# Patient Record
Sex: Female | Born: 2000 | Race: Black or African American | Hispanic: No | Smoking: Never smoker
Health system: Southern US, Community
[De-identification: ages and names within clinical notes are randomized; demographics above are authoritative.]

## PROBLEM LIST (undated history)

## (undated) ENCOUNTER — Ambulatory Visit (HOSPITAL_COMMUNITY): Payer: MEDICAID

## (undated) DIAGNOSIS — F32A Depression, unspecified: Secondary | ICD-10-CM

## (undated) DIAGNOSIS — B009 Herpesviral infection, unspecified: Secondary | ICD-10-CM

## (undated) DIAGNOSIS — J302 Other seasonal allergic rhinitis: Secondary | ICD-10-CM

## (undated) DIAGNOSIS — I10 Essential (primary) hypertension: Secondary | ICD-10-CM

## (undated) HISTORY — PX: TONSILLECTOMY: SUR1361

---
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
## 2019-01-18 ENCOUNTER — Other Ambulatory Visit: Payer: Self-pay

## 2019-01-18 ENCOUNTER — Encounter (HOSPITAL_COMMUNITY): Payer: Self-pay | Admitting: Emergency Medicine

## 2019-01-18 ENCOUNTER — Ambulatory Visit (HOSPITAL_COMMUNITY)
Admission: EM | Admit: 2019-01-18 | Discharge: 2019-01-18 | Disposition: A | Discharge status: Home / Self Care | Payer: BC Managed Care – PPO | Attending: Internal Medicine | Admitting: Internal Medicine

## 2019-01-18 DIAGNOSIS — R300 Dysuria: Secondary | ICD-10-CM | POA: Diagnosis not present

## 2019-01-18 LAB — POCT URINALYSIS DIP (DEVICE)
Bilirubin Urine: NEGATIVE
Glucose, UA: NEGATIVE mg/dL
Ketones, ur: NEGATIVE mg/dL
Nitrite: NEGATIVE
Protein, ur: NEGATIVE mg/dL
Specific Gravity, Urine: 1.02 (ref 1.005–1.030)
Urobilinogen, UA: 0.2 mg/dL (ref 0.0–1.0)
pH: 7 (ref 5.0–8.0)

## 2019-01-18 MED ORDER — CEPHALEXIN 500 MG PO CAPS: 500.0000 mg | ORAL_CAPSULE | Freq: Two times a day (BID) | ORAL | 0 refills | Status: AC

## 2019-01-18 NOTE — ED Provider Notes (Signed)
[facility]    CSN: [id number] Arrival date & time: 01/18/19  1351      History   Chief Complaint Chief Complaint  Patient presents with  . Recurrent UTI    HPI Brooke Robinson is a 18 y.o. female history of hypertension, presenting today for evaluation of possible UTI.  Patient states that over the past couple days she has developed dysuria, cloudy urine and urinary frequency.  Feels similar to when she has previously had UTIs.  She has had these more recurrently of recently as she notes that she has not been urinating after intercourse.  Feels this has been a trigger.  She denies any abnormal vaginal discharge, itching or irritation.  Denies fevers nausea or vomiting.  Denies abdominal pain.  HPI  Past Medical History:  Diagnosis Date  . Herpes   . Hypertension   . Seasonal allergies     Patient Active Problem List   Diagnosis Date Noted  . Pregnant 08/27/2018    Past Surgical History:  Procedure Laterality Date  . TONSILLECTOMY      OB History    Gravida  1   Para  1   Term  1   Preterm      AB      Living  1     SAB      TAB      Ectopic      Multiple  0   Live Births  1            Home Medications    Prior to Admission medications   Medication Sig Start Date End Date Taking? Authorizing Provider  acyclovir (ZOVIRAX) 400 MG tablet TAKE 1 TABLET BY MOUTH TWICE DAILY FOR SUPPRESSION THERAPY. 11/03/18  Yes [provider]  levonorgestrel (MIRENA, 52 MG,) 20 MCG/24HR IUD Mirena 20 mcg/24 hours (6 yrs) 52 mg intrauterine device  Take 1 device by intrauterine route.   Yes [provider]  cephALEXin (KEFLEX) 500 MG capsule Take 1 capsule (500 mg total) by mouth 2 (two) times daily for 7 days. 01/18/19 01/25/19  Wieters, Hallie C, PA-C  ibuprofen (ADVIL) 600 MG tablet Take 1 tablet (600 mg total) by mouth every 6 (six) hours as needed. 08/29/18   Waynard Reedsoss, Kendra, MD  Prenatal Vit-Fe Fumarate-FA (MULTIVITAMIN-PRENATAL)  27-0.8 MG TABS tablet Take 1 tablet by mouth daily at 12 noon.    [provider]  fluticasone (FLONASE ALLERGY RELIEF) 50 MCG/ACT nasal spray Place 2 sprays into the nose daily as needed for allergies.   01/18/19  [provider]    Family History Family History  Problem Relation Age of Onset  . Healthy Mother     Social History Social History   Tobacco Use  . Smoking status: Never Smoker  . Smokeless tobacco: Never Used  Substance Use Topics  . Alcohol use: Never    Frequency: Never  . Drug use: Never     Allergies   Patient has no known allergies.   Review of Systems Review of Systems  Constitutional: Negative for fever.  Respiratory: Negative for shortness of breath.   Cardiovascular: Negative for chest pain.  Gastrointestinal: Negative for abdominal pain, diarrhea, nausea and vomiting.  Genitourinary: Positive for dysuria and frequency. Negative for flank pain, genital sores, hematuria, menstrual problem, vaginal bleeding, vaginal discharge and vaginal pain.  Musculoskeletal: Negative for back pain.  Skin: Negative for rash.  Neurological: Negative for dizziness, light-headedness and headaches.     Physical Exam  Triage Vital Signs ED Triage Vitals  Enc Vitals Group     BP 01/18/19 1452 (!) 140/93     Pulse Rate 01/18/19 1452 61     Resp 01/18/19 1452 12     Temp 01/18/19 1452 98.6 F (37 C)     Temp Source 01/18/19 1452 Oral     SpO2 01/18/19 1452 100 %     Weight --      Height --      Head Circumference --      Peak Flow --      Pain Score 01/18/19 1453 0     Pain Loc --      Pain Edu? --      Excl. in GC? --    No data found.  Updated Vital Signs BP (!) 140/93 (BP Location: Right Arm)   Pulse 61   Temp 98.6 F (37 C) (Oral)   Resp 12   SpO2 100%   Visual Acuity Right Eye Distance:   Left Eye Distance:   Bilateral Distance:    Right Eye Near:   Left Eye Near:    Bilateral Near:     Physical Exam Vitals signs and  nursing note reviewed.  Constitutional:      Appearance: She is well-developed.     Comments: No acute distress  HENT:     Head: Normocephalic and atraumatic.     Nose: Nose normal.  Eyes:     Conjunctiva/sclera: Conjunctivae normal.  Neck:     Musculoskeletal: Neck supple.  Cardiovascular:     Rate and Rhythm: Normal rate.  Pulmonary:     Effort: Pulmonary effort is normal. No respiratory distress.  Abdominal:     General: There is no distension.     Comments: Soft, nondistended, nontender to light and deep palpation throughout abdomen  Musculoskeletal: Normal range of motion.  Skin:    General: Skin is warm and dry.  Neurological:     Mental Status: She is alert and oriented to person, place, and time.      UC Treatments / Results  Labs (all labs ordered are listed, but only abnormal results are displayed) Labs Reviewed  POCT URINALYSIS DIP (DEVICE) - Abnormal; Notable for the following components:      Result Value   Hgb urine dipstick TRACE (*)    Leukocytes,Ua MODERATE (*)    All other components within normal limits  URINE CULTURE    EKG   Radiology No results found.  Procedures Procedures (including critical care time)  Medications Ordered in UC Medications - No data to display  Initial Impression / Assessment and Plan / UC Course  I have reviewed the triage vital signs and the nursing notes.  Pertinent labs & imaging results that were available during my care of the patient were reviewed by me and considered in my medical decision making (see chart for details).     Moderate leuks on UA, will send for culture.  Previous culture grew out Klebsiella and was sensitive to Keflex.  Will repeat treatment with Keflex today.  Will repeat culture.  Push fluids.  Continue to monitor,Discussed strict return precautions. Patient verbalized understanding and is agreeable with plan.  Final Clinical Impressions(s) / UC Diagnoses   Final diagnoses:  Dysuria      Discharge Instructions     Urine showed evidence of infection. We are treating you with keflex, twice daily for 1 week. Be sure to take full course. Stay hydrated- urine should  be pale yellow to clear. May use over the counter azo for relief of burning while infection is being cleared.   Please return or follow up with your primary provider if symptoms not improving with treatment. Please return sooner if you have worsening of symptoms or develop fever, nausea, vomiting, abdominal pain, back pain, lightheadedness, dizziness.   ED Prescriptions    Medication Sig Dispense Auth. Provider   cephALEXin (KEFLEX) 500 MG capsule Take 1 capsule (500 mg total) by mouth 2 (two) times daily for 7 days. 14 capsule Wieters, Castalia C, PA-C     PDMP not reviewed this encounter.   Janith Lima, [place] 01/18/19 2209

## 2019-01-18 NOTE — Discharge Instructions (Signed)
Urine showed evidence of infection. We are treating you with keflex, twice daily for 1 week. Be sure to take full course. Stay hydrated- urine should be pale yellow to clear. May use over the counter azo for relief of burning while infection is being cleared.   Please return or follow up with your primary provider if symptoms not improving with treatment. Please return sooner if you have worsening of symptoms or develop fever, nausea, vomiting, abdominal pain, back pain, lightheadedness, dizziness.

## 2019-01-19 ENCOUNTER — Encounter (HOSPITAL_COMMUNITY): Payer: Self-pay

## 2019-01-19 ENCOUNTER — Ambulatory Visit (HOSPITAL_COMMUNITY)
Admission: EM | Admit: 2019-01-19 | Discharge: 2019-01-19 | Disposition: A | Discharge status: Home / Self Care | Payer: BC Managed Care – PPO | Attending: Emergency Medicine | Admitting: Emergency Medicine

## 2019-01-19 DIAGNOSIS — S0990XA Unspecified injury of head, initial encounter: Secondary | ICD-10-CM | POA: Diagnosis not present

## 2019-01-19 MED ORDER — IBUPROFEN 600 MG PO TABS: 600.0000 mg | ORAL_TABLET | Freq: Four times a day (QID) | ORAL | 0 refills | Status: DC | PRN

## 2019-01-19 NOTE — Discharge Instructions (Addendum)
You were diagnosed today with a mild head injury Please follow up with your primary care provider Please go to nearest ED if  your symptom get worse or you are having Inabilty to awaken at time of expected wakening Severe or worsening headache Somnolence or confusion Restlessness, unsteadiness or seizures Difficulty with vision Vomiting, fever or stiff neck Urinary or bowel incontinence weakness and numbness involving any body part

## 2019-01-19 NOTE — ED Provider Notes (Signed)
[facility]    CSN: [id number] Arrival date & time: 01/19/19  1725      History   Chief Complaint Chief Complaint  Patient presents with  . Head Injury    HPI Brooke Robinson is a 18 y.o. female.   18 year old presented at the Urgent Care with a complaint mild headache after 2 boxed hit her head.  She was loading the truck and the boxes fell off and hit the occipital part of her head. She stated the boxes weight about 30 pounds each. She denied vomiting, abnormal behavior and agitation at this time.  The history is provided by the patient. No language interpreter was used.  Head Injury Associated symptoms: headache   Associated symptoms: no numbness     Past Medical History:  Diagnosis Date  . Herpes   . Hypertension   . Seasonal allergies     Patient Active Problem List   Diagnosis Date Noted  . Pregnant 08/27/2018    Past Surgical History:  Procedure Laterality Date  . TONSILLECTOMY      OB History    Gravida  1   Para  1   Term  1   Preterm      AB      Living  1     SAB      TAB      Ectopic      Multiple  0   Live Births  1            Home Medications    Prior to Admission medications   Medication Sig Start Date End Date Taking? Authorizing Provider  acyclovir (ZOVIRAX) 400 MG tablet TAKE 1 TABLET BY MOUTH TWICE DAILY FOR SUPPRESSION THERAPY. 11/03/18   [provider]  cephALEXin (KEFLEX) 500 MG capsule Take 1 capsule (500 mg total) by mouth 2 (two) times daily for 7 days. 01/18/19 01/25/19  Wieters, Hallie C, PA-C  ibuprofen (ADVIL) 600 MG tablet Take 1 tablet (600 mg total) by mouth every 6 (six) hours as needed. 01/19/19   Denim Kalmbach, Zachery Dakins, FNP  levonorgestrel (MIRENA, 52 MG,) 20 MCG/24HR IUD Mirena 20 mcg/24 hours (6 yrs) 52 mg intrauterine device  Take 1 device by intrauterine route.    [provider]  Prenatal Vit-Fe Fumarate-FA (MULTIVITAMIN-PRENATAL) 27-0.8 MG TABS tablet Take 1 tablet by  mouth daily at 12 noon.    [provider]  fluticasone (FLONASE ALLERGY RELIEF) 50 MCG/ACT nasal spray Place 2 sprays into the nose daily as needed for allergies.   01/18/19  [provider]    Family History Family History  Problem Relation Age of Onset  . Healthy Mother     Social History Social History   Tobacco Use  . Smoking status: Never Smoker  . Smokeless tobacco: Never Used  Substance Use Topics  . Alcohol use: Never    Frequency: Never  . Drug use: Never     Allergies   Patient has no known allergies.   Review of Systems Review of Systems  Constitutional: Negative for activity change, appetite change, chills, fatigue and fever.  HENT: Negative for sinus pain and sore throat.   Respiratory: Negative for cough and chest tightness.   Cardiovascular: Negative for chest pain and leg swelling.  Neurological: Positive for light-headedness and headaches. Negative for dizziness, tremors, speech difficulty, weakness and numbness.  ROS: all other are negatives  Physical Exam Triage Vital Signs ED Triage Vitals  Enc Vitals Group  BP 01/19/19 1739 139/77     Pulse Rate 01/19/19 1739 72     Resp 01/19/19 1739 14     Temp 01/19/19 1739 98.7 F (37.1 C)     Temp Source 01/19/19 1739 Oral     SpO2 01/19/19 1739 100 %     Weight --      Height --      Head Circumference --      Peak Flow --      Pain Score 01/19/19 1737 5     Pain Loc --      Pain Edu? --      Excl. in GC? --    No data found.  Updated Vital Signs BP 139/77 (BP Location: Right Arm)   Pulse 72   Temp 98.7 F (37.1 C) (Oral)   Resp 14   LMP 12/18/2018 (Exact Date)   SpO2 100%   Visual Acuity Right Eye Distance:   Left Eye Distance:   Bilateral Distance:    Right Eye Near:   Left Eye Near:    Bilateral Near:     Physical Exam Vitals signs and nursing note reviewed.  Constitutional:      General: She is not in acute distress.    Appearance: Normal appearance.  She is normal weight. She is not ill-appearing or toxic-appearing.  HENT:     Head: Normocephalic.     Right Ear: Tympanic membrane, ear canal and external ear normal.     Left Ear: Tympanic membrane, ear canal and external ear normal.  Eyes:     Pupils: Pupils are equal, round, and reactive to light.  Cardiovascular:     Rate and Rhythm: Normal rate and regular rhythm.     Pulses: Normal pulses.     Heart sounds: Normal heart sounds. No murmur. No gallop.   Pulmonary:     Effort: Pulmonary effort is normal. No respiratory distress.     Breath sounds: Normal breath sounds. No stridor. No wheezing or rhonchi.  Neurological:     Mental Status: She is alert and oriented to person, place, and time.     GCS: GCS eye subscore is 4. GCS verbal subscore is 5. GCS motor subscore is 6.     Cranial Nerves: Cranial nerves are intact. No facial asymmetry.     Sensory: Sensation is intact. No sensory deficit.     Motor: Motor function is intact. No weakness or tremor.     Coordination: Coordination is intact. Coordination normal. Heel to Shin Test normal.     Gait: Gait is intact. Gait normal.     Deep Tendon Reflexes:     Reflex Scores:      Patellar reflexes are 2+ on the right side.     UC Treatments / Results  Labs (all labs ordered are listed, but only abnormal results are displayed) Labs Reviewed - No data to display  EKG   Radiology No results found.  Procedures Procedures (including critical care time)  Medications Ordered in UC Medications - No data to display  Initial Impression / Assessment and Plan / UC Course  I have reviewed the triage vital signs and the nursing notes.  Pertinent labs & imaging results that were available during my care of the patient were reviewed by me and considered in my medical decision making (see chart for details).   patient is stable and was discharge home. Ibuprofen was prescribed for pain management and she was advised to follow up with  PCP or go to ED if symptom get worse  Final Clinical Impressions(s) / UC Diagnoses   Final diagnoses:  Injury of head, initial encounter     Discharge Instructions     You were diagnosed today with a mild head injury Please follow up with your primary care provider Please go to nearest ED if  your symptom get worse or you are having Inabilty to awaken at time of expected wakening Severe or worsening headache Somnolence or confusion Restlessness, unsteadiness or seizures Difficulty with vision Vomiting, fever or stiff neck Urinary or bowel incontinence weakness and numbness involving any body part    ED Prescriptions    Medication Sig Dispense Auth. Provider   ibuprofen (ADVIL) 600 MG tablet Take 1 tablet (600 mg total) by mouth every 6 (six) hours as needed. 30 tablet Sanjay Broadfoot, Darrelyn Hillock, FNP     PDMP not reviewed this encounter.   Emerson Monte, [place] 01/19/19 1843

## 2019-01-19 NOTE — ED Triage Notes (Signed)
Pt states she injured her head last night at work, 2 boxes over 30 pounds hit her head. Pt states she feels sleepy and mild headache since last night. Pt has concern for concussions as she had concussions before. Pt denies any other symptoms.

## 2019-01-20 ENCOUNTER — Encounter (HOSPITAL_COMMUNITY): Payer: Self-pay

## 2019-01-20 ENCOUNTER — Telehealth (HOSPITAL_COMMUNITY): Payer: Self-pay | Admitting: Emergency Medicine

## 2019-01-20 LAB — URINE CULTURE: Culture: 60000 — AB

## 2019-01-20 MED ORDER — CIPROFLOXACIN HCL 500 MG PO TABS: 500.0000 mg | ORAL_TABLET | Freq: Two times a day (BID) | ORAL | 0 refills | Status: AC

## 2019-01-20 NOTE — Telephone Encounter (Signed)
Urine culture was treated with keflex, urine culture shows two separate organisms. Per [facility], stop taking the medication she is taking right now, and switch to cipro 500mg  BID x7 days. No working number on file. Notified in mychart.

## 2019-01-22 ENCOUNTER — Telehealth: Payer: Self-pay | Admitting: Emergency Medicine

## 2019-01-22 NOTE — Telephone Encounter (Signed)
Attempted to reach patient x2. No answer at this time. Call cannot be completed.   

## 2019-01-25 ENCOUNTER — Telehealth: Payer: Self-pay | Admitting: Emergency Medicine

## 2019-01-25 NOTE — Telephone Encounter (Signed)
Attempted to reach patient x3. No answer at this time. No number on file working. Letter sent

## 2019-03-06 ENCOUNTER — Other Ambulatory Visit: Payer: Self-pay

## 2019-03-06 ENCOUNTER — Encounter (HOSPITAL_COMMUNITY): Payer: Self-pay | Admitting: Emergency Medicine

## 2019-03-06 ENCOUNTER — Ambulatory Visit (HOSPITAL_COMMUNITY)
Admission: EM | Admit: 2019-03-06 | Discharge: 2019-03-06 | Disposition: A | Discharge status: Home / Self Care | Payer: BC Managed Care – PPO | Attending: Family Medicine | Admitting: Family Medicine

## 2019-03-06 DIAGNOSIS — Z0189 Encounter for other specified special examinations: Secondary | ICD-10-CM | POA: Insufficient documentation

## 2019-03-06 DIAGNOSIS — I10 Essential (primary) hypertension: Secondary | ICD-10-CM | POA: Diagnosis not present

## 2019-03-06 DIAGNOSIS — R490 Dysphonia: Secondary | ICD-10-CM | POA: Diagnosis not present

## 2019-03-06 DIAGNOSIS — Z1152 Encounter for screening for COVID-19: Secondary | ICD-10-CM

## 2019-03-06 DIAGNOSIS — U071 COVID-19: Secondary | ICD-10-CM | POA: Insufficient documentation

## 2019-03-06 NOTE — Discharge Instructions (Signed)
If your Covid-19 test is positive, you will receive a phone call from [facility] regarding your results. Negative test results are not called. Both positive and negative results area always visible on MyChart. If you do not have a MyChart account, sign up instructions are in your discharge papers.  

## 2019-03-06 NOTE — ED Triage Notes (Signed)
Onset yesterday of feeling bad losing her voice

## 2019-03-07 LAB — NOVEL CORONAVIRUS, NAA (HOSP ORDER, SEND-OUT TO REF LAB; TAT 18-24 HRS): SARS-CoV-2, NAA: DETECTED — AB

## 2019-03-08 NOTE — ED Provider Notes (Signed)
[facility]   [id number] 03/06/19 Arrival Time: 4097  ASSESSMENT & PLAN:  1. Patient request for diagnostic testing      COVID-19 testing sent. To self-quarantine until results are available. If requested, work note provided.  Follow-up Information    [facility].   Specialty: Urgent Care Why: As needed. Contact information: [place] [phone]          Reviewed expectations re: course of current medical issues. Questions answered. Outlined signs and symptoms indicating need for more acute intervention. Patient verbalized understanding. After Visit Summary given.   SUBJECTIVE: History from: patient. Brooke Robinson is a 19 y.o. female who requests COVID-19 testing. Known COVID-19 contact: none. Recent travel: none. Denies: runny nose, congestion, fever, cough, sore throat, difficulty breathing and headache. Does reports "feeling bad and losing my voice a little" since yesterday. Normal PO intake without n/v/d.  ROS: As per HPI.   OBJECTIVE:  Vitals:   03/06/19 1551  BP: (!) 145/92  Pulse: 97  Resp: 16  Temp: 98.6 F (37 C)  TempSrc: Oral  SpO2: 99%    General appearance: alert; no distress Eyes: PERRLA; EOMI; conjunctiva normal HENT: [place]; AT; nasal mucosa normal; oral mucosa normal Neck: supple  Lungs: speaks full sentences without difficulty; unlabored Heart: regular rate and rhythm Abdomen: soft, non-tender Extremities: no edema Skin: warm and dry Neurologic: normal gait Psychological: alert and cooperative; normal mood and affect    No Known Allergies  Past Medical History:  Diagnosis Date  . Herpes   . Hypertension   . Seasonal allergies    Social History   Socioeconomic History  . Marital status: Single    Spouse name: Not on file  . Number of children: Not on file  . Years of education: Not on file  . Highest education level: Not on file    Occupational History  . Not on file  Tobacco Use  . Smoking status: Never Smoker  . Smokeless tobacco: Never Used  Substance and Sexual Activity  . Alcohol use: Never  . Drug use: Never  . Sexual activity: Not Currently  Other Topics Concern  . Not on file  Social History Narrative  . Not on file   Social Determinants of Health   Financial Resource Strain:   . Difficulty of Paying Living Expenses: Not on file  Food Insecurity:   . Worried About Charity fundraiser in the Last Year: Not on file  . Ran Out of Food in the Last Year: Not on file  Transportation Needs:   . Lack of Transportation (Medical): Not on file  . Lack of Transportation (Non-Medical): Not on file  Physical Activity:   . Days of Exercise per Week: Not on file  . Minutes of Exercise per Session: Not on file  Stress:   . Feeling of Stress : Not on file  Social Connections:   . Frequency of Communication with Friends and Family: Not on file  . Frequency of Social Gatherings with Friends and Family: Not on file  . Attends Religious Services: Not on file  . Active Member of Clubs or Organizations: Not on file  . Attends Archivist Meetings: Not on file  . Marital Status: Not on file  Intimate Partner Violence:   . Fear of Current or Ex-Partner: Not on file  . Emotionally Abused: Not on file  . Physically Abused: Not on file  . Sexually Abused:  Not on file   Family History  Problem Relation Age of Onset  . Healthy Mother    Past Surgical History:  Procedure Laterality Date  . Greig Right, MD 03/08/19 [phone]

## 2019-03-09 ENCOUNTER — Telehealth: Payer: Self-pay | Admitting: Emergency Medicine

## 2019-03-09 NOTE — Telephone Encounter (Signed)

## 2019-03-22 ENCOUNTER — Ambulatory Visit (HOSPITAL_COMMUNITY)
Admission: EM | Admit: 2019-03-22 | Discharge: 2019-03-22 | Disposition: A | Discharge status: Home / Self Care | Payer: BC Managed Care – PPO | Attending: Family Medicine | Admitting: Family Medicine

## 2019-03-22 ENCOUNTER — Other Ambulatory Visit: Payer: Self-pay

## 2019-03-22 ENCOUNTER — Encounter (HOSPITAL_COMMUNITY): Payer: Self-pay

## 2019-03-22 DIAGNOSIS — R102 Pelvic and perineal pain: Secondary | ICD-10-CM | POA: Insufficient documentation

## 2019-03-22 DIAGNOSIS — Z8616 Personal history of COVID-19: Secondary | ICD-10-CM | POA: Diagnosis not present

## 2019-03-22 DIAGNOSIS — I10 Essential (primary) hypertension: Secondary | ICD-10-CM | POA: Insufficient documentation

## 2019-03-22 DIAGNOSIS — Z793 Long term (current) use of hormonal contraceptives: Secondary | ICD-10-CM | POA: Insufficient documentation

## 2019-03-22 MED ORDER — NAPROXEN 500 MG PO TABS: 500.0000 mg | ORAL_TABLET | Freq: Two times a day (BID) | ORAL | 0 refills | Status: DC

## 2019-03-22 NOTE — ED Triage Notes (Signed)
Pt presents with lower pelvic pain X 3 days; pt believes it to be related to her IUD that was placed in august.  Pt was positive for covid on 03/06/2019

## 2019-03-22 NOTE — ED Provider Notes (Signed)
[place]    CSN: [id number] Arrival date & time: 03/22/19  1734      History   Chief Complaint Chief Complaint  Patient presents with  . Abdominal Pain    Lower Pelvic    HPI Brooke Robinson is a 19 y.o. female.   HPI  Patient has pelvic crampy pain for 3 days.  She thinks is from her IUD.  She figured this out by looking up on the Internet.  She has not had any problems with her IUD since it was placed.  She has a 45-month-old child.  She is not breast-feeding. She was Covid positive on 03/06/2019.  She had minimal symptoms.  She is no longer symptomatic. She has unprotected sexual relations.  She is certain she is not pregnant because of the IUD.  She is not having any vaginal discharge, bleeding, or signs of infection  Past Medical History:  Diagnosis Date  . Herpes   . Hypertension   . Seasonal allergies     Patient Active Problem List   Diagnosis Date Noted  . Pregnant 08/27/2018    Past Surgical History:  Procedure Laterality Date  . TONSILLECTOMY      OB History    Gravida  1   Para  1   Term  1   Preterm      AB      Living  1     SAB      TAB      Ectopic      Multiple  0   Live Births  1            Home Medications    Prior to Admission medications   Medication Sig Start Date End Date Taking? Authorizing Provider  acyclovir (ZOVIRAX) 400 MG tablet TAKE 1 TABLET BY MOUTH TWICE DAILY FOR SUPPRESSION THERAPY. 11/03/18   [provider]  levonorgestrel (MIRENA, 52 MG,) 20 MCG/24HR IUD Mirena 20 mcg/24 hours (6 yrs) 52 mg intrauterine device  Take 1 device by intrauterine route.    [provider]  naproxen (NAPROSYN) 500 MG tablet Take 1 tablet (500 mg total) by mouth 2 (two) times daily. 03/22/19   Raylene Everts, MD  Prenatal Vit-Fe Fumarate-FA (MULTIVITAMIN-PRENATAL) 27-0.8 MG TABS tablet Take 1 tablet by mouth daily at 12 noon.    [provider]  fluticasone (FLONASE ALLERGY RELIEF) 50  MCG/ACT nasal spray Place 2 sprays into the nose daily as needed for allergies.   01/18/19  [provider]    Family History Family History  Problem Relation Age of Onset  . Healthy Mother     Social History Social History   Tobacco Use  . Smoking status: Never Smoker  . Smokeless tobacco: Never Used  Substance Use Topics  . Alcohol use: Never  . Drug use: Never     Allergies   Patient has no known allergies.   Review of Systems Review of Systems  Constitutional: Negative for chills and fever.  Gastrointestinal: Positive for abdominal pain.  Genitourinary: Positive for pelvic pain. Negative for dysuria, vaginal bleeding and vaginal discharge.     Physical Exam Triage Vital Signs ED Triage Vitals [03/22/19 1853]  Enc Vitals Group     BP (!) 147/95     Pulse Rate 73     Resp 17     Temp 98.5 F (36.9 C)     Temp Source Oral     SpO2 100 %  Weight      Height      Head Circumference      Peak Flow      Pain Score 7     Pain Loc      Pain Edu?      Excl. in GC?    No data found.  Updated Vital Signs BP (!) 147/95 (BP Location: Left Arm)   Pulse 73   Temp 98.5 F (36.9 C) (Oral)   Resp 17   SpO2 100%   Physical Exam Constitutional:      General: She is not in acute distress.    Appearance: She is well-developed.  HENT:     Head: Normocephalic and atraumatic.  Eyes:     Conjunctiva/sclera: Conjunctivae normal.     Pupils: Pupils are equal, round, and reactive to light.  Cardiovascular:     Rate and Rhythm: Normal rate.  Pulmonary:     Effort: Pulmonary effort is normal. No respiratory distress.  Abdominal:     General: Abdomen is flat. Bowel sounds are normal. There is no distension.     Palpations: Abdomen is soft.     Tenderness: There is abdominal tenderness in the suprapubic area.     Comments: Abdomen is soft.  Minimal tenderness to deep palpation of the pelvis without guarding or rebound  Musculoskeletal:        General:  Normal range of motion.     Cervical back: Normal range of motion.  Skin:    General: Skin is warm and dry.  Neurological:     Mental Status: She is alert.  Psychiatric:        Mood and Affect: Mood normal.        Behavior: Behavior normal.      UC Treatments / Results  Labs (all labs ordered are listed, but only abnormal results are displayed) Labs Reviewed  CERVICOVAGINAL ANCILLARY ONLY    EKG   Radiology No results found.  Procedures Procedures (including critical care time)  Medications Ordered in UC Medications - No data to display  Initial Impression / Assessment and Plan / UC Course  I have reviewed the triage vital signs and the nursing notes.  Pertinent labs & imaging results that were available during my care of the patient were reviewed by me and considered in my medical decision making (see chart for details).      Final Clinical Impressions(s) / UC Diagnoses   Final diagnoses:  Pelvic pain in female     Discharge Instructions     Take the naproxen 2 x a day  This is for pelvic pain See your OB GYN about the IUD We will call if your cultures are positive for infection   ED Prescriptions    Medication Sig Dispense Auth. Provider   naproxen (NAPROSYN) 500 MG tablet Take 1 tablet (500 mg total) by mouth 2 (two) times daily. 30 tablet Eustace Moore, MD     PDMP not reviewed this encounter.   Eustace Moore, MD 03/22/19 Barry Brunner

## 2019-03-22 NOTE — Discharge Instructions (Addendum)
Take the naproxen 2 x a day  This is for pelvic pain See your OB GYN about the IUD We will call if your cultures are positive for infection

## 2019-03-24 LAB — CERVICOVAGINAL ANCILLARY ONLY
Bacterial vaginitis: POSITIVE — AB
Chlamydia: NEGATIVE
Neisseria Gonorrhea: NEGATIVE
Trichomonas: NEGATIVE

## 2019-03-25 ENCOUNTER — Encounter (HOSPITAL_COMMUNITY): Payer: Self-pay

## 2019-03-25 ENCOUNTER — Telehealth (HOSPITAL_COMMUNITY): Payer: Self-pay | Admitting: Emergency Medicine

## 2019-03-25 MED ORDER — METRONIDAZOLE 500 MG PO TABS: 500.0000 mg | ORAL_TABLET | Freq: Two times a day (BID) | ORAL | 0 refills | Status: AC

## 2019-03-25 NOTE — Telephone Encounter (Signed)
Bacterial vaginosis is positive. Pt needs treatment. Flagyl 500 mg BID x 7 days #14 no refills sent to patients pharmacy of choice.    Attempted to reach patient. No answer at this time. Voicemail left.     

## 2019-07-14 ENCOUNTER — Other Ambulatory Visit: Payer: Self-pay

## 2019-07-14 ENCOUNTER — Ambulatory Visit (HOSPITAL_COMMUNITY)
Admission: EM | Admit: 2019-07-14 | Discharge: 2019-07-14 | Disposition: A | Discharge status: Home / Self Care | Payer: BC Managed Care – PPO | Attending: Family Medicine | Admitting: Family Medicine

## 2019-07-14 ENCOUNTER — Encounter (HOSPITAL_COMMUNITY): Payer: Self-pay | Admitting: Emergency Medicine

## 2019-07-14 DIAGNOSIS — Z20822 Contact with and (suspected) exposure to covid-19: Secondary | ICD-10-CM | POA: Diagnosis present

## 2019-07-14 NOTE — Discharge Instructions (Addendum)
You have been tested for COVID-19 today. °If your test returns positive, you will receive a phone call from [place] regarding your results. °Negative test results are not called. °Both positive and negative results area always visible on MyChart. °If you do not have a MyChart account, sign up instructions are provided in your discharge papers. °Please do not hesitate to contact us should you have questions or concerns. ° °

## 2019-07-14 NOTE — ED Triage Notes (Signed)
Needs covid test for travel, no symptoms.  

## 2019-07-14 NOTE — ED Provider Notes (Signed)
  [facility]   [id number] 07/14/19 Arrival Time: 1517  ASSESSMENT & PLAN:  1. Encounter for screening laboratory testing for COVID-19 virus      COVID-19 testing sent for upcoming travel.  Follow-up Information    [facility].   Specialty: Urgent Care Why: As needed. Contact information: [address] [place] [address] [phone]          Reviewed expectations re: course of current medical issues. Questions answered. Outlined signs and symptoms indicating need for more acute intervention. Understanding verbalized. After Visit Summary given.   SUBJECTIVE: History from: patient. Brooke Robinson is a 19 y.o. female who requests COVID-19 testing. Known COVID-19 contact: none. Recent travel: none. Denies: runny nose, congestion, fever, cough, sore throat, difficulty breathing and headache. Normal PO intake without n/v/d.    OBJECTIVE:  Vitals:   07/14/19 1617  BP: (!) 143/83  Pulse: 81  Resp: 16  Temp: 98.7 F (37.1 C)  TempSrc: Oral  SpO2: 100%    General appearance: alert; no distress Eyes: PERRLA; EOMI; conjunctiva normal HENT: [place]; AT; nasal mucosa normal; oral mucosa normal Neck: supple  Lungs: speaks full sentences without difficulty; unlabored Extremities: no edema Skin: warm and dry Neurologic: normal gait Psychological: alert and cooperative; normal mood and affect  Labs:  Labs Reviewed  SARS CORONAVIRUS 2 (TAT 6-24 HRS)      No Known Allergies  Past Medical History:  Diagnosis Date  . Herpes   . Hypertension   . Seasonal allergies    Social History   Socioeconomic History  . Marital status: Significant Other    Spouse name: Not on file  . Number of children: Not on file  . Years of education: Not on file  . Highest education level: Not on file  Occupational History  . Not on file  Tobacco Use  . Smoking status: Never Smoker  . Smokeless tobacco: Never Used    Substance and Sexual Activity  . Alcohol use: Never  . Drug use: Never  . Sexual activity: Not Currently  Other Topics Concern  . Not on file  Social History Narrative  . Not on file   Social Determinants of Health   Financial Resource Strain:   . Difficulty of Paying Living Expenses:   Food Insecurity:   . Worried About Programme researcher, broadcasting/film/video in the Last Year:   . Barista in the Last Year:   Transportation Needs:   . Freight forwarder (Medical):   Marland Kitchen Lack of Transportation (Non-Medical):   Physical Activity:   . Days of Exercise per Week:   . Minutes of Exercise per Session:   Stress:   . Feeling of Stress :   Social Connections:   . Frequency of Communication with Friends and Family:   . Frequency of Social Gatherings with Friends and Family:   . Attends Religious Services:   . Active Member of Clubs or Organizations:   . Attends Banker Meetings:   Marland Kitchen Marital Status:   Intimate Partner Violence:   . Fear of Current or Ex-Partner:   . Emotionally Abused:   Marland Kitchen Physically Abused:   . Sexually Abused:    Family History  Problem Relation Age of Onset  . Healthy Mother    Past Surgical History:  Procedure Laterality Date  . Greig Right, MD 07/14/19 1651

## 2019-07-15 LAB — SARS CORONAVIRUS 2 (TAT 6-24 HRS): SARS Coronavirus 2: NEGATIVE

## 2019-09-06 ENCOUNTER — Emergency Department (HOSPITAL_COMMUNITY)
Admission: EM | Admit: 2019-09-06 | Discharge: 2019-09-07 | Disposition: A | Discharge status: Home / Self Care | Payer: BC Managed Care – PPO | Attending: Emergency Medicine | Admitting: Emergency Medicine

## 2019-09-06 ENCOUNTER — Other Ambulatory Visit: Payer: Self-pay

## 2019-09-06 DIAGNOSIS — Z79899 Other long term (current) drug therapy: Secondary | ICD-10-CM | POA: Insufficient documentation

## 2019-09-06 DIAGNOSIS — I10 Essential (primary) hypertension: Secondary | ICD-10-CM | POA: Diagnosis not present

## 2019-09-06 DIAGNOSIS — N3001 Acute cystitis with hematuria: Secondary | ICD-10-CM | POA: Diagnosis not present

## 2019-09-06 DIAGNOSIS — R109 Unspecified abdominal pain: Secondary | ICD-10-CM | POA: Diagnosis present

## 2019-09-06 NOTE — ED Triage Notes (Signed)
Midline abdominal pain, started about 11am today, denies n/v/d. Also has had some lower back pain, says she thinks she pulled a muscle.

## 2019-09-07 ENCOUNTER — Emergency Department (HOSPITAL_COMMUNITY): Payer: BC Managed Care – PPO

## 2019-09-07 ENCOUNTER — Encounter (HOSPITAL_COMMUNITY): Payer: Self-pay | Admitting: *Deleted

## 2019-09-07 LAB — LIPASE, BLOOD: Lipase: 19 U/L (ref 11–51)

## 2019-09-07 LAB — COMPREHENSIVE METABOLIC PANEL
ALT: 10 U/L (ref 0–44)
AST: 14 U/L — ABNORMAL LOW (ref 15–41)
Albumin: 4.2 g/dL (ref 3.5–5.0)
Alkaline Phosphatase: 48 U/L (ref 38–126)
Anion gap: 10 (ref 5–15)
BUN: 7 mg/dL (ref 6–20)
CO2: 25 mmol/L (ref 22–32)
Calcium: 9.6 mg/dL (ref 8.9–10.3)
Chloride: 102 mmol/L (ref 98–111)
Creatinine, Ser: 0.96 mg/dL (ref 0.44–1.00)
GFR calc Af Amer: >60 mL/min (ref >60)
GFR calc non Af Amer: >60 mL/min (ref >60)
Glucose, Bld: 83 mg/dL (ref 70–99)
Potassium: 4.2 mmol/L (ref 3.5–5.1)
Sodium: 137 mmol/L (ref 135–145)
Total Bilirubin: 0.7 mg/dL (ref 0.3–1.2)
Total Protein: 7.8 g/dL (ref 6.5–8.1)

## 2019-09-07 LAB — I-STAT BETA HCG BLOOD, ED (MC, WL, AP ONLY): I-stat hCG, quantitative: <5 m[IU]/mL (ref <5)

## 2019-09-07 LAB — URINALYSIS, ROUTINE W REFLEX MICROSCOPIC
Bilirubin Urine: NEGATIVE
Glucose, UA: NEGATIVE mg/dL
Ketones, ur: NEGATIVE mg/dL
Nitrite: NEGATIVE
Protein, ur: NEGATIVE mg/dL
RBC / HPF: >50 RBC/hpf — ABNORMAL HIGH (ref 0–5)
Specific Gravity, Urine: 1.009 (ref 1.005–1.030)
WBC, UA: >50 WBC/hpf — ABNORMAL HIGH (ref 0–5)
pH: 6 (ref 5.0–8.0)

## 2019-09-07 LAB — CBC
HCT: 35.8 % — ABNORMAL LOW (ref 36.0–46.0)
Hemoglobin: 12 g/dL (ref 12.0–15.0)
MCH: 28.8 pg (ref 26.0–34.0)
MCHC: 33.5 g/dL (ref 30.0–36.0)
MCV: 85.9 fL (ref 80.0–100.0)
Platelets: 294 10*3/uL (ref 150–400)
RBC: 4.17 MIL/uL (ref 3.87–5.11)
RDW: 13.5 % (ref 11.5–15.5)
WBC: 9.3 10*3/uL (ref 4.0–10.5)
nRBC: 0 % (ref 0.0–0.2)

## 2019-09-07 MED ORDER — FENTANYL CITRATE (PF) 100 MCG/2ML IJ SOLN
50.0000 ug | Freq: Once | INTRAMUSCULAR | Status: AC
  Administered 2019-09-07: 50 ug via INTRAVENOUS
  Filled 2019-09-07: qty 2

## 2019-09-07 MED ORDER — SODIUM CHLORIDE 0.9 % IV SOLN
1.0000 g | Freq: Once | INTRAVENOUS | Status: AC
  Administered 2019-09-07: 1 g via INTRAVENOUS
  Filled 2019-09-07: qty 10

## 2019-09-07 MED ORDER — KETOROLAC TROMETHAMINE 30 MG/ML IJ SOLN
30.0000 mg | Freq: Once | INTRAMUSCULAR | Status: AC
  Administered 2019-09-07: 30 mg via INTRAVENOUS
  Filled 2019-09-07: qty 1

## 2019-09-07 MED ORDER — ONDANSETRON HCL 4 MG/2ML IJ SOLN
4.0000 mg | Freq: Once | INTRAMUSCULAR | Status: AC
  Administered 2019-09-07: 4 mg via INTRAVENOUS
  Filled 2019-09-07: qty 2

## 2019-09-07 MED ORDER — ONDANSETRON 4 MG PO TBDP: 4.0000 mg | ORAL_TABLET | Freq: Three times a day (TID) | ORAL | 0 refills | Status: DC | PRN

## 2019-09-07 MED ORDER — CEPHALEXIN 500 MG PO CAPS: 500.0000 mg | ORAL_CAPSULE | Freq: Four times a day (QID) | ORAL | 0 refills | Status: AC

## 2019-09-07 MED ORDER — SODIUM CHLORIDE 0.9% FLUSH
3.0000 mL | Freq: Once | INTRAVENOUS | Status: AC
  Administered 2019-09-07: 3 mL via INTRAVENOUS

## 2019-09-07 NOTE — Discharge Instructions (Addendum)
Thank you for allowing me to care for you today in the Emergency Department.   Take 1 tablet of Keflex every 6 hours for the infection in your urine.  Your urine has been sent for culture.   I would also recommend starting an over-the-counter probiotic to help with your symptoms.  Take 650 mg of Tylenol or 600 mg of ibuprofen with food every 6 hours for pain.  You can alternate between these 2 medications every 3 hours if your pain returns.  For instance, you can take Tylenol at noon, followed by a dose of ibuprofen at 3, followed by second dose of Tylenol and 6.  Let 1 tablet of Zofran dissolve in your tongue every 8 hours as needed for nausea or vomiting.  Please follow-up with your primary care clinician in the next week for a recheck of your symptoms.  Return to the emergency department if you develop uncontrollable vomiting, uncontrollable pain, if you stop producing urine, or develop other new, concerning symptoms.

## 2019-09-07 NOTE — ED Notes (Signed)
Discharge instructions discussed with pt. Pt verbalized understanding. Pt stable and ambulatory. No signature pad available. 

## 2019-09-07 NOTE — ED Notes (Signed)
Pt tolerating PO fluids well

## 2019-09-07 NOTE — ED Provider Notes (Signed)
[facility] EMERGENCY DEPARTMENT Provider Note   CSN: [id number] Arrival date & time: 09/06/19  2334     History Chief Complaint  Patient presents with  . Abdominal Pain    Brooke Robinson is a 19 y.o. female with a history of seasonal allergies, hypertension, and COVID-19 in January 2020 who presents the emergency department with a chief complaint of abdominal pain.  The patient reports that yesterday she developed some right flank pain that she thought was a pulled muscle from several weeks ago that had seemed to resolve until the pain returned yesterday.  This morning, she awoke to 10 out of 10, sharp suprapubic abdominal pain that radiates up into the abdomen.  Pain is much worse with movement and improved with laying very still.  She did note a small amount of hematuria 2 days ago, but this is since resolved.  She was seen by her primary care clinician earlier this week and started on an antibiotic that she was taking Macrobid 2x daily for 3 days for a UTI.  Last dose was Sunday, 7/4.  No fevers, chills, nausea, vomiting, diarrhea, constipation, vaginal pain or discharge, chest pain, shortness of breath.  She attempted to treat her symptoms with Pepto-Bismol for gas earlier today with no improvement in her symptoms.  No history of kidney stones. Last BM was yesterday and normal. LMP 08/17/19.  No recent travel.  No known sick contacts.  The history is provided by the patient and medical records. No language interpreter was used.       Past Medical History:  Diagnosis Date  . Herpes   . Hypertension   . Seasonal allergies     Patient Active Problem List   Diagnosis Date Noted  . Pregnant 08/27/2018    Past Surgical History:  Procedure Laterality Date  . TONSILLECTOMY       OB History    Gravida  1   Para  1   Term  1   Preterm      AB      Living  1     SAB      TAB      Ectopic      Multiple  0   Live Births  1           Family  History  Problem Relation Age of Onset  . Healthy Mother     Social History   Tobacco Use  . Smoking status: Never Smoker  . Smokeless tobacco: Never Used  Vaping Use  . Vaping Use: Never used  Substance Use Topics  . Alcohol use: Never  . Drug use: Never    Home Medications Prior to Admission medications   Medication Sig Start Date End Date Taking? Authorizing Provider  acyclovir (ZOVIRAX) 400 MG tablet TAKE 1 TABLET BY MOUTH TWICE DAILY FOR SUPPRESSION THERAPY. 11/03/18   [provider]  cephALEXin (KEFLEX) 500 MG capsule Take 1 capsule (500 mg total) by mouth 4 (four) times daily for 7 days. 09/07/19 09/14/19  Jazelle Achey A, PA-C  levonorgestrel (MIRENA, 52 MG,) 20 MCG/24HR IUD Mirena 20 mcg/24 hours (6 yrs) 52 mg intrauterine device  Take 1 device by intrauterine route.    [provider]  naproxen (NAPROSYN) 500 MG tablet Take 1 tablet (500 mg total) by mouth 2 (two) times daily. 03/22/19   Eustace Moore, MD  ondansetron (ZOFRAN ODT) 4 MG disintegrating tablet Take 1 tablet (4 mg total) by mouth every 8 (  eight) hours as needed. 09/07/19   Mason Dibiasio A, PA-C  Prenatal Vit-Fe Fumarate-FA (MULTIVITAMIN-PRENATAL) 27-0.8 MG TABS tablet Take 1 tablet by mouth daily at 12 noon.    [provider]  fluticasone (FLONASE ALLERGY RELIEF) 50 MCG/ACT nasal spray Place 2 sprays into the nose daily as needed for allergies.   01/18/19  [provider]    Allergies    Patient has no known allergies.  Review of Systems   Review of Systems  Constitutional: Negative for activity change, chills and fever.  HENT: Negative for congestion and sore throat.   Respiratory: Negative for shortness of breath.   Cardiovascular: Negative for chest pain.  Gastrointestinal: Positive for abdominal pain and nausea. Negative for blood in stool, constipation, diarrhea and vomiting.  Genitourinary: Positive for dysuria and flank pain. Negative for hematuria, urgency,  vaginal discharge and vaginal pain.  Musculoskeletal: Negative for back pain, gait problem, myalgias, neck pain and neck stiffness.  Skin: Negative for rash.  Allergic/Immunologic: Negative for immunocompromised state.  Neurological: Negative for seizures, syncope, weakness, numbness and headaches.  Psychiatric/Behavioral: Negative for confusion.    Physical Exam Updated Vital Signs BP 115/71   Pulse (!) 57   Temp 98.3 F (36.8 C) (Oral)   Resp 14   Ht 5\' 4"  (1.626 m)   Wt 67.1 kg   LMP 08/18/2019   SpO2 99%   BMI 25.40 kg/m   Physical Exam Vitals and nursing note reviewed.  Constitutional:      General: She is not in acute distress.    Appearance: She is not ill-appearing, toxic-appearing or diaphoretic.  HENT:     Head: Normocephalic.  Eyes:     Conjunctiva/sclera: Conjunctivae normal.  Cardiovascular:     Rate and Rhythm: Normal rate and regular rhythm.     Heart sounds: No murmur heard.  No friction rub. No gallop.   Pulmonary:     Effort: Pulmonary effort is normal. No respiratory distress.     Breath sounds: No stridor. No wheezing, rhonchi or rales.  Chest:     Chest wall: No tenderness.  Abdominal:     General: There is no distension.     Palpations: Abdomen is soft. There is no mass.     Tenderness: There is abdominal tenderness. There is right CVA tenderness. There is no left CVA tenderness, guarding or rebound.     Hernia: No hernia is present.     Comments: Tender to palpation in the suprapubic region.  There is right CVA tenderness.  Negative Murphy sign.  No tenderness over McBurney's point.  No rebound or guarding.  Normoactive bowel sounds.  Musculoskeletal:     Cervical back: Neck supple.  Skin:    General: Skin is warm.     Findings: No rash.  Neurological:     Mental Status: She is alert.  Psychiatric:        Behavior: Behavior normal.     ED Results / Procedures / Treatments   Labs (all labs ordered are listed, but only abnormal results  are displayed) Labs Reviewed  COMPREHENSIVE METABOLIC PANEL - Abnormal; Notable for the following components:      Result Value   AST 14 (*)    All other components within normal limits  CBC - Abnormal; Notable for the following components:   HCT 35.8 (*)    All other components within normal limits  URINALYSIS, ROUTINE W REFLEX MICROSCOPIC - Abnormal; Notable for the following components:   APPearance HAZY (*)  Hgb urine dipstick MODERATE (*)    Leukocytes,Ua LARGE (*)    RBC / HPF >50 (*)    WBC, UA >50 (*)    Bacteria, UA RARE (*)    All other components within normal limits  URINE CULTURE  LIPASE, BLOOD  I-STAT BETA HCG BLOOD, ED ([facility], [facility], [facility] ONLY)    EKG None  Radiology CT Renal Stone Study  Result Date: 09/07/2019 CLINICAL DATA:  Flank pain, stone disease suspected EXAM: CT ABDOMEN AND PELVIS WITHOUT CONTRAST TECHNIQUE: Multidetector CT imaging of the abdomen and pelvis was performed following the standard protocol without IV contrast. COMPARISON:  None. FINDINGS: Lower chest: Lung bases are clear. Normal heart size. No pericardial effusion. Hepatobiliary: Normal hepatic attenuation. Smooth liver surface contour. No visible liver lesions on this unenhanced CT. Normal gallbladder and biliary tree. No visible calcified gallstones. Pancreas: Unremarkable. No pancreatic ductal dilatation or surrounding inflammatory changes. Spleen: Normal in size without focal abnormality. Adrenals/Urinary Tract: Adrenal glands are unremarkable. Kidneys are normal, without renal calculi, focal lesion, or hydronephrosis. Bladder is unremarkable. Stomach/Bowel: Distal esophagus, stomach and duodenal sweep are unremarkable. Multiple fluid-filled loops of small bowel are seen in pelvis without frank dilatation or evidence of high-grade obstruction. Moderate colonic stool burden. Mild edematous thickening of the cecum superimposed on some more chronic intramural could reflect acute inflammation on sequela of  more remote post inflammatory change. Vascular/Lymphatic: No significant vascular findings. Some reactive mesenteric adenopathy is suspected. No pathologically enlarged nodes in the abdomen or pelvis. Reproductive: Anteverted uterus. Normal follicles in the ovaries. No concerning adnexal lesions. Other: No abdominopelvic free air. Small amount fluid seen in the posterior cul-de-sac. Tiny calcification in the posterior cul-de-sac as well, possible peritoneal loose body. Musculoskeletal: No acute osseous abnormality or suspicious osseous lesion. IMPRESSION: 1. Multiple fluid-filled loops of small bowel in the pelvis with mild edematous thickening of the cecum could be seen in the setting of a mild enterocolitis. 2. No urolithiasis, hydronephrosis or other acute urinary tract abnormality. 3. Small amount of fluid in the posterior cul-de-sac, can physiologic in a reproductive age female. 4. Intramural fat at the level of the cecum could reflect sequela prior inflammation. Electronically Signed   By: Kreg ShropshirePrice  DeHay M.D.   On: 09/07/2019 03:57    Procedures Procedures (including critical care time)  Medications Ordered in ED Medications  sodium chloride flush (NS) 0.9 % injection 3 mL (3 mLs Intravenous Given 09/07/19 0414)  cefTRIAXone (ROCEPHIN) 1 g in sodium chloride 0.9 % 100 mL IVPB (0 g Intravenous Stopped 09/07/19 0451)  ondansetron (ZOFRAN) injection 4 mg (4 mg Intravenous Given 09/07/19 0414)  fentaNYL (SUBLIMAZE) injection 50 mcg (50 mcg Intravenous Given 09/07/19 0413)  ketorolac (TORADOL) 30 MG/ML injection 30 mg (30 mg Intravenous Given 09/07/19 0458)    ED Course  I have reviewed the triage vital signs and the nursing notes.  Pertinent labs & imaging results that were available during my care of the patient were reviewed by me and considered in my medical decision making (see chart for details).    MDM Rules/Calculators/A&P                          19 year old female with a history of seasonal  allergies, hypertension, and COVID-19 in January 2020 presenting with right flank pain since yesterday, which the patient thought was a recurrence of a pulled muscle from several weeks ago that had resolved in the interim until yesterday.  She has  developed suprapubic abdominal pain today and had a small amount of hematuria 48 hours ago that is since resolved.  She was treated with a 3-day course of Macrobid by primary care, which she has completed.  No constitutional symptoms.  No vomiting, diarrhea, constipation.  No vaginal discharge or pain.  Vital signs are normal.  She is afebrile.  No leukocytosis.  No electrolyte derangements.  Pregnancy test is negative.  UA with hemoglobinuria, leukocyte esterase, WBC clumps, and pyuria.  Patient has had previous urine cultures with Klebsiella pneumoniae.  We will give a dose of Rocephin in the ER.  Given her flank pain and hematuria, there is concern for obstructive uropathy in the setting of UTI so renal stone study was obtained, which was negative for stones.  Radiology questioned mild enteritis, but patient is having no vomiting or diarrhea.  She also has no leukocytosis.  I have a low suspicion for C. difficile, pancreatitis, cholecystitis, appendicitis, bowel obstruction, diverticulitis, TOA, ectopic pregnancy.  The patient was discussed with Dr. Nicanor Alcon, attending physician.  On reevaluation, patient's pain has significantly improved and is almost entirely resolved.  She has tolerated fluids without any vomiting in the ER.  Will discharge with Keflex for UTI.  She was advised to follow-up with primary care for recheck of her symptoms within the next week.  ER return precautions given.  She is hemodynamically stable to no acute distress.  Safe for discharge home with outpatient follow-up as indicated.   Final Clinical Impression(s) / ED Diagnoses Final diagnoses:  Acute cystitis with hematuria    Rx / DC Orders ED Discharge Orders         Ordered     cephALEXin (KEFLEX) 500 MG capsule  4 times daily     Discontinue  Reprint     09/07/19 0532    ondansetron (ZOFRAN ODT) 4 MG disintegrating tablet  Every 8 hours PRN     Discontinue  Reprint     09/07/19 0532           Trent Gabler A, PA-C 09/07/19 0754    Palumbo, April, MD 09/08/19 3545

## 2019-09-09 LAB — URINE CULTURE: Culture: >=100000 — AB

## 2019-09-10 ENCOUNTER — Telehealth: Payer: Self-pay | Admitting: Emergency Medicine

## 2019-09-10 NOTE — Telephone Encounter (Signed)
Post ED Visit - Positive Culture Follow-up  Culture report reviewed by antimicrobial stewardship pharmacist: [facility] []  , Pharm.D. []  Enzo Bi, Pharm.D., BCPS AQ-ID []  , Pharm.D., BCPS []  Celedonio Miyamoto, Pharm.D., BCPS []  Emma, Garvin Fila.D., BCPS, AAHIVP []  , Pharm.D., BCPS, AAHIVP [x]  Georgina Pillion, PharmD, BCPS []  , PharmD, BCPS []  [place], PharmD, BCPS []  [address], PharmD []  , PharmD, BCPS []  Estella Husk, PharmD  Pharmacy Team []  Lysle Pearl, PharmD []  , PharmD []  Phillips Climes, PharmD []  , Rph []  Agapito Games) , PharmD []  Verlan Friends, PharmD []  , PharmD []  Mervyn Gay, PharmD []  , PharmD []  Vinnie Level, PharmD []  [facility], PharmD []  , PharmD []  Len Childs, PharmD   Positive urine culture Treated with Cephalexin, organism sensitive to the same and no further patient follow-up is required at this time.  Brooke Robinson 09/10/2019, 11:46 AM

## 2019-09-24 ENCOUNTER — Other Ambulatory Visit: Payer: Self-pay

## 2019-09-24 ENCOUNTER — Encounter (HOSPITAL_COMMUNITY): Payer: Self-pay | Admitting: Emergency Medicine

## 2019-09-24 ENCOUNTER — Ambulatory Visit (HOSPITAL_COMMUNITY)
Admission: EM | Admit: 2019-09-24 | Discharge: 2019-09-24 | Disposition: A | Discharge status: Home / Self Care | Payer: BC Managed Care – PPO | Attending: Family Medicine | Admitting: Family Medicine

## 2019-09-24 DIAGNOSIS — Z3A01 Less than 8 weeks gestation of pregnancy: Secondary | ICD-10-CM

## 2019-09-24 LAB — POC URINE PREG, ED: Preg Test, Ur: POSITIVE — AB

## 2019-09-24 LAB — POCT URINALYSIS DIP (DEVICE)
Bilirubin Urine: NEGATIVE
Glucose, UA: NEGATIVE mg/dL
Hgb urine dipstick: NEGATIVE
Ketones, ur: NEGATIVE mg/dL
Leukocytes,Ua: NEGATIVE
Nitrite: NEGATIVE
Protein, ur: NEGATIVE mg/dL
Specific Gravity, Urine: 1.02 (ref 1.005–1.030)
Urobilinogen, UA: 0.2 mg/dL (ref 0.0–1.0)
pH: 6 (ref 5.0–8.0)

## 2019-09-24 NOTE — ED Notes (Signed)
Patient requesting a work note stating she is pregnant.

## 2019-09-24 NOTE — ED Provider Notes (Signed)
[facility]    CSN: [id number] Arrival date & time: 09/24/19  1646      History   Chief Complaint Chief Complaint  Patient presents with  . Possible Pregnancy    HPI Brooke Robinson is a 19 y.o. female.   HPI  Patient is here for pregnancy test. She did test at home and it was positive She is happy to be pregnant again  Past Medical History:  Diagnosis Date  . Herpes   . Hypertension   . Seasonal allergies     Patient Active Problem List   Diagnosis Date Noted  . Pregnant 08/27/2018    Past Surgical History:  Procedure Laterality Date  . TONSILLECTOMY      OB History    Gravida  1   Para  1   Term  1   Preterm      AB      Living  1     SAB      TAB      Ectopic      Multiple  0   Live Births  1            Home Medications    Prior to Admission medications   Medication Sig Start Date End Date Taking? Authorizing Provider  acyclovir (ZOVIRAX) 400 MG tablet TAKE 1 TABLET BY MOUTH TWICE DAILY FOR SUPPRESSION THERAPY. 11/03/18   [provider]  levonorgestrel (MIRENA, 52 MG,) 20 MCG/24HR IUD Mirena 20 mcg/24 hours (6 yrs) 52 mg intrauterine device  Take 1 device by intrauterine route.    [provider]  naproxen (NAPROSYN) 500 MG tablet Take 1 tablet (500 mg total) by mouth 2 (two) times daily. 03/22/19   Eustace Moore, MD  ondansetron (ZOFRAN ODT) 4 MG disintegrating tablet Take 1 tablet (4 mg total) by mouth every 8 (eight) hours as needed. 09/07/19   McDonald, Mia A, PA-C  Prenatal Vit-Fe Fumarate-FA (MULTIVITAMIN-PRENATAL) 27-0.8 MG TABS tablet Take 1 tablet by mouth daily at 12 noon.    [provider]  fluticasone (FLONASE ALLERGY RELIEF) 50 MCG/ACT nasal spray Place 2 sprays into the nose daily as needed for allergies.   01/18/19  [provider]    Family History Family History  Problem Relation Age of Onset  . Healthy Mother     Social History Social History   Tobacco Use   . Smoking status: Never Smoker  . Smokeless tobacco: Never Used  Vaping Use  . Vaping Use: Never used  Substance Use Topics  . Alcohol use: Never  . Drug use: Never     Allergies   Patient has no known allergies.   Review of Systems Review of Systems Amenorrhea  Physical Exam Triage Vital Signs ED Triage Vitals  Enc Vitals Group     BP 09/24/19 1836 (!) 147/97     Pulse Rate 09/24/19 1836 64     Resp 09/24/19 1836 15     Temp 09/24/19 1836 98.9 F (37.2 C)     Temp Source 09/24/19 1836 Oral     SpO2 09/24/19 1836 100 %     Weight --      Height --      Head Circumference --      Peak Flow --      Pain Score 09/24/19 1832 0     Pain Loc --      Pain Edu? --      Excl. in GC? --  No data found.  Updated Vital Signs BP (!) 147/97 (BP Location: Left Arm)   Pulse 64   Temp 98.9 F (37.2 C) (Oral)   Resp 15   LMP 08/17/2019   SpO2 100%      Physical Exam Constitutional:      General: She is not in acute distress.    Appearance: She is well-developed.  HENT:     Head: Normocephalic and atraumatic.  Eyes:     Conjunctiva/sclera: Conjunctivae normal.     Pupils: Pupils are equal, round, and reactive to light.  Cardiovascular:     Rate and Rhythm: Normal rate.  Pulmonary:     Effort: Pulmonary effort is normal. No respiratory distress.  Abdominal:     General: There is no distension.     Palpations: Abdomen is soft.  Musculoskeletal:        General: Normal range of motion.     Cervical back: Normal range of motion.  Skin:    General: Skin is warm and dry.  Neurological:     Mental Status: She is alert.  Psychiatric:        Mood and Affect: Mood normal.        Behavior: Behavior normal.      UC Treatments / Results  Labs (all labs ordered are listed, but only abnormal results are displayed) Labs Reviewed  POC URINE PREG, ED - Abnormal; Notable for the following components:      Result Value   Preg Test, Ur POSITIVE (*)    All other  components within normal limits  POCT URINALYSIS DIP (DEVICE)    EKG   Radiology No results found.  Procedures Procedures (including critical care time)  Medications Ordered in UC Medications - No data to display  Initial Impression / Assessment and Plan / UC Course  I have reviewed the triage vital signs and the nursing notes.  Pertinent labs & imaging results that were available during my care of the patient were reviewed by me and considered in my medical decision making (see chart for details).      Final Clinical Impressions(s) / UC Diagnoses   Final diagnoses:  Less than [redacted] weeks gestation of pregnancy     Discharge Instructions     Follow up with OB GYN   ED Prescriptions    None     PDMP not reviewed this encounter.   Eustace Moore, MD 09/24/19 2053

## 2019-09-24 NOTE — Discharge Instructions (Addendum)
Follow up with OBGYN.

## 2019-09-24 NOTE — ED Triage Notes (Signed)
Pt states she took a home pregnancy test last Saturday and she tested positive and just needed a confirmation. She states lmp was 6/15.

## 2019-11-17 LAB — OB RESULTS CONSOLE RUBELLA ANTIBODY, IGM: Rubella: IMMUNE

## 2019-11-17 LAB — OB RESULTS CONSOLE ANTIBODY SCREEN: Antibody Screen: NEGATIVE

## 2019-11-17 LAB — OB RESULTS CONSOLE RPR: RPR: NONREACTIVE

## 2019-11-17 LAB — OB RESULTS CONSOLE ABO/RH: RH Type: POSITIVE

## 2019-11-17 LAB — OB RESULTS CONSOLE HEPATITIS B SURFACE ANTIGEN: Hepatitis B Surface Ag: NEGATIVE

## 2019-11-17 LAB — OB RESULTS CONSOLE HIV ANTIBODY (ROUTINE TESTING): HIV: NONREACTIVE

## 2019-12-20 ENCOUNTER — Encounter (HOSPITAL_COMMUNITY): Payer: Self-pay | Admitting: Emergency Medicine

## 2019-12-20 ENCOUNTER — Emergency Department (HOSPITAL_COMMUNITY)
Admission: EM | Admit: 2019-12-20 | Discharge: 2019-12-20 | Disposition: A | Discharge status: Home / Self Care | Payer: BC Managed Care – PPO | Attending: Emergency Medicine | Admitting: Emergency Medicine

## 2019-12-20 ENCOUNTER — Other Ambulatory Visit: Payer: Self-pay

## 2019-12-20 DIAGNOSIS — O10012 Pre-existing essential hypertension complicating pregnancy, second trimester: Secondary | ICD-10-CM | POA: Diagnosis not present

## 2019-12-20 DIAGNOSIS — R109 Unspecified abdominal pain: Secondary | ICD-10-CM

## 2019-12-20 DIAGNOSIS — R1013 Epigastric pain: Secondary | ICD-10-CM | POA: Insufficient documentation

## 2019-12-20 DIAGNOSIS — O26892 Other specified pregnancy related conditions, second trimester: Secondary | ICD-10-CM | POA: Insufficient documentation

## 2019-12-20 DIAGNOSIS — Z3A17 17 weeks gestation of pregnancy: Secondary | ICD-10-CM | POA: Insufficient documentation

## 2019-12-20 DIAGNOSIS — R102 Pelvic and perineal pain: Secondary | ICD-10-CM | POA: Diagnosis not present

## 2019-12-20 LAB — COMPREHENSIVE METABOLIC PANEL
ALT: 10 U/L (ref 0–44)
AST: 13 U/L — ABNORMAL LOW (ref 15–41)
Albumin: 3.3 g/dL — ABNORMAL LOW (ref 3.5–5.0)
Alkaline Phosphatase: 37 U/L — ABNORMAL LOW (ref 38–126)
Anion gap: 9 (ref 5–15)
BUN: 6 mg/dL (ref 6–20)
CO2: 20 mmol/L — ABNORMAL LOW (ref 22–32)
Calcium: 9.3 mg/dL (ref 8.9–10.3)
Chloride: 106 mmol/L (ref 98–111)
Creatinine, Ser: 0.72 mg/dL (ref 0.44–1.00)
GFR, Estimated: >60 mL/min (ref >60)
Glucose, Bld: 103 mg/dL — ABNORMAL HIGH (ref 70–99)
Potassium: 3.8 mmol/L (ref 3.5–5.1)
Sodium: 135 mmol/L (ref 135–145)
Total Bilirubin: 0.5 mg/dL (ref 0.3–1.2)
Total Protein: 6.4 g/dL — ABNORMAL LOW (ref 6.5–8.1)

## 2019-12-20 LAB — URINALYSIS, ROUTINE W REFLEX MICROSCOPIC
Bilirubin Urine: NEGATIVE
Glucose, UA: NEGATIVE mg/dL
Hgb urine dipstick: NEGATIVE
Ketones, ur: NEGATIVE mg/dL
Leukocytes,Ua: NEGATIVE
Nitrite: NEGATIVE
Protein, ur: NEGATIVE mg/dL
Specific Gravity, Urine: 1.013 (ref 1.005–1.030)
pH: 5 (ref 5.0–8.0)

## 2019-12-20 LAB — CBC
HCT: 32.1 % — ABNORMAL LOW (ref 36.0–46.0)
Hemoglobin: 11 g/dL — ABNORMAL LOW (ref 12.0–15.0)
MCH: 28.1 pg (ref 26.0–34.0)
MCHC: 34.3 g/dL (ref 30.0–36.0)
MCV: 81.9 fL (ref 80.0–100.0)
Platelets: 270 10*3/uL (ref 150–400)
RBC: 3.92 MIL/uL (ref 3.87–5.11)
RDW: 13.5 % (ref 11.5–15.5)
WBC: 6.9 10*3/uL (ref 4.0–10.5)
nRBC: 0 % (ref 0.0–0.2)

## 2019-12-20 LAB — LIPASE, BLOOD: Lipase: 24 U/L (ref 11–51)

## 2019-12-20 MED ORDER — ACETAMINOPHEN 500 MG PO TABS
1000.0000 mg | ORAL_TABLET | Freq: Once | ORAL | Status: AC
  Administered 2019-12-20: 1000 mg via ORAL

## 2019-12-20 NOTE — ED Notes (Signed)
FHTs per Dr. Myrtis Ser 148

## 2019-12-20 NOTE — ED Triage Notes (Addendum)
Pt is [redacted] weeks pregnant with upper medial ab pain and lower left ab pain after moving dog kennel yesterday and phone argument with boyfriend yesterday. No pain at this time. Pt says she is feeling the baby kick. No discharge.

## 2019-12-20 NOTE — ED Notes (Signed)
Pt reports improvement in headache and abdominal pain after tylenol. Pt discharged to home and instructed to follow up with OB/GYN. Pt verbalized understanding of written and verbal discharge instructions provided and all questions addressed. Pt ambulated out of ER with steady gait; no distress noted.

## 2019-12-20 NOTE — ED Notes (Signed)
Pt sitting up in bed; no distress noted. Alert and awake. Oriented to person, place and time. Respirations even and unlabored. Skin appears warm and dry; skin color WNL. Moving all extremities. Pt c/o mild abdominal pain in upper midline abdomen that started yesterday after doing some moving of dog furniture. States that pain is intermittent and starting to come back. No medications taken PTA. Blood work drawn from straight stick in LAC; pt tolerated well. Specimens sent to lab.

## 2019-12-20 NOTE — ED Notes (Signed)
Pt up to bathroom and instructed on providing a urine specimen. Pt alert and awake. Respirations even and unlabored. Skin appears warm and dry; skin color WNL.

## 2019-12-20 NOTE — ED Provider Notes (Signed)
[facility] EMERGENCY DEPARTMENT Provider Note   CSN: [id number] Arrival date & time: 12/20/19  1248     History Chief Complaint  Patient presents with  . Abdominal Pain    Brooke Robinson is a 19 y.o. female.   Abdominal Pain Pain location:  Epigastric Pain quality: aching   Pain radiates to:  Does not radiate Pain severity:  Moderate Onset quality:  Sudden Timing:  Intermittent Progression:  Resolved Chronicity:  New Context comment:  Moving furniture Relieved by:  Nothing Worsened by:  Nothing Ineffective treatments:  None tried Associated symptoms: no anorexia, no belching, no chest pain, no chills, no cough, no diarrhea, no dysuria, no fever, no nausea, no shortness of breath, no vaginal bleeding, no vaginal discharge and no vomiting        Past Medical History:  Diagnosis Date  . Herpes   . Hypertension   . Seasonal allergies     Patient Active Problem List   Diagnosis Date Noted  . Pregnant 08/27/2018    Past Surgical History:  Procedure Laterality Date  . TONSILLECTOMY       OB History    Gravida  1   Para  1   Term  1   Preterm      AB      Living  1     SAB      TAB      Ectopic      Multiple  0   Live Births  1           Family History  Problem Relation Age of Onset  . Healthy Mother     Social History   Tobacco Use  . Smoking status: Never Smoker  . Smokeless tobacco: Never Used  Vaping Use  . Vaping Use: Never used  Substance Use Topics  . Alcohol use: Never  . Drug use: Never    Home Medications Prior to Admission medications   Medication Sig Start Date End Date Taking? Authorizing Provider  acyclovir (ZOVIRAX) 400 MG tablet TAKE 1 TABLET BY MOUTH TWICE DAILY FOR SUPPRESSION THERAPY. 11/03/18   [provider]  levonorgestrel (MIRENA, 52 MG,) 20 MCG/24HR IUD Mirena 20 mcg/24 hours (6 yrs) 52 mg intrauterine device  Take 1 device by intrauterine route.    [provider]  naproxen (NAPROSYN) 500 MG tablet Take 1 tablet (500 mg total) by mouth 2 (two) times daily. 03/22/19   Eustace Moore, MD  ondansetron (ZOFRAN ODT) 4 MG disintegrating tablet Take 1 tablet (4 mg total) by mouth every 8 (eight) hours as needed. 09/07/19   McDonald, Mia A, PA-C  Prenatal Vit-Fe Fumarate-FA (MULTIVITAMIN-PRENATAL) 27-0.8 MG TABS tablet Take 1 tablet by mouth daily at 12 noon.    [provider]  fluticasone (FLONASE ALLERGY RELIEF) 50 MCG/ACT nasal spray Place 2 sprays into the nose daily as needed for allergies.   01/18/19  [provider]    Allergies    Patient has no known allergies.  Review of Systems   Review of Systems  Constitutional: Negative for chills and fever.  HENT: Negative for congestion and rhinorrhea.   Respiratory: Negative for cough and shortness of breath.   Cardiovascular: Negative for chest pain and palpitations.  Gastrointestinal: Positive for abdominal pain. Negative for anorexia, diarrhea, nausea and vomiting.  Genitourinary: Negative for difficulty urinating, dysuria, vaginal bleeding and vaginal discharge.  Musculoskeletal: Negative for arthralgias and back pain.  Skin: Negative for rash and wound.  Neurological: Negative for light-headedness and headaches.    Physical Exam Updated Vital Signs BP 114/71 (BP Location: Left Arm)   Pulse 89   Temp 98.6 F (37 C) (Oral)   Resp 12   SpO2 100%   Physical Exam Vitals and nursing note reviewed. Exam conducted with a chaperone present.  Constitutional:      General: She is not in acute distress.    Appearance: Normal appearance.  HENT:     Head: Normocephalic and atraumatic.     Nose: No rhinorrhea.  Eyes:     General:        Right eye: No discharge.        Left eye: No discharge.     Conjunctiva/sclera: Conjunctivae normal.  Cardiovascular:     Rate and Rhythm: Normal rate and regular rhythm.  Pulmonary:     Effort: Pulmonary effort is normal. No respiratory  distress.     Breath sounds: No stridor.  Abdominal:     General: Abdomen is flat. There is no distension.     Palpations: Abdomen is soft.     Tenderness: There is abdominal tenderness in the epigastric area. There is no right CVA tenderness, left CVA tenderness, guarding or rebound. Negative signs include Murphy's sign, Rovsing's sign, McBurney's sign and psoas sign.     Hernia: No hernia is present.  Musculoskeletal:        General: No tenderness or signs of injury.  Skin:    General: Skin is warm and dry.  Neurological:     General: No focal deficit present.     Mental Status: She is alert. Mental status is at baseline.     Motor: No weakness.  Psychiatric:        Mood and Affect: Mood normal.        Behavior: Behavior normal.     ED Results / Procedures / Treatments   Labs (all labs ordered are listed, but only abnormal results are displayed) Labs Reviewed  CBC - Abnormal; Notable for the following components:      Result Value   Hemoglobin 11.0 (*)    HCT 32.1 (*)    All other components within normal limits  COMPREHENSIVE METABOLIC PANEL - Abnormal; Notable for the following components:   CO2 20 (*)    Glucose, Bld 103 (*)    Total Protein 6.4 (*)    Albumin 3.3 (*)    AST 13 (*)    Alkaline Phosphatase 37 (*)    All other components within normal limits  LIPASE, BLOOD  URINALYSIS, ROUTINE W REFLEX MICROSCOPIC    EKG None  Radiology No results found.  Procedures Ultrasound ED OB Pelvic  Date/Time: 12/20/2019 2:21 PM Performed by: Sabino Donovan, MD Authorized by: Sabino Donovan, MD   Procedure details:    Indications: evaluate for IUP and pregnant with abdominal pain     Assess:  Fetal viability   Images: archived    Uterine findings:    Intrauterine pregnancy: identified     Single gestation: identified      Comments:     FHR 148, positive for intermittent fetal movement   (including critical care time)  Medications Ordered in ED Medications    acetaminophen (TYLENOL) tablet 1,000 mg (1,000 mg Oral Given 12/20/19 1405)    ED Course  I have reviewed the triage vital signs and the nursing notes.  Pertinent labs & imaging results that were available during my care of the patient were  reviewed by me and considered in my medical decision making (see chart for details).    MDM Rules/Calculators/A&P                          Intermittent epigastric pain, benign abdominal exam, no pelvic tenderness pelvic pain, no bleeding, patient is feeling baby move, this is her second pregnancy, she has a anatomy scan coming up this week.  Denies any concerning vaginal discharge.  Denies any urinary symptoms or GI symptoms.  I spoke with Dr. Lodema Hong on-call at the [facility], he agrees to just do a screening lab studies to make sure there is no significant derangements and then to have her follow-up with her OB/GYN.  Ultrasound done by myself at bedside shows a fetal heart rate of 148 and good fetal movement.  Laboratory studies reviewed by myself show no significant electrolyte derangement or organ dysfunction.  No inflammation of the organs.  CBC is unremarkable.  She is safe for outpatient management with her OB/GYN.  Return precautions were provided. Final Clinical Impression(s) / ED Diagnoses Final diagnoses:  Undifferentiated abdominal pain    Rx / DC Orders ED Discharge Orders    None       Sabino Donovan, MD 12/20/19 1443

## 2020-02-04 ENCOUNTER — Emergency Department (HOSPITAL_COMMUNITY)
Admission: EM | Admit: 2020-02-04 | Discharge: 2020-02-04 | Disposition: A | Discharge status: Home / Self Care | Payer: BC Managed Care – PPO | Attending: Emergency Medicine | Admitting: Emergency Medicine

## 2020-02-04 ENCOUNTER — Other Ambulatory Visit: Payer: Self-pay

## 2020-02-04 ENCOUNTER — Encounter (HOSPITAL_COMMUNITY): Payer: Self-pay

## 2020-02-04 DIAGNOSIS — I10 Essential (primary) hypertension: Secondary | ICD-10-CM | POA: Diagnosis not present

## 2020-02-04 DIAGNOSIS — M545 Low back pain, unspecified: Secondary | ICD-10-CM | POA: Diagnosis not present

## 2020-02-04 DIAGNOSIS — M549 Dorsalgia, unspecified: Secondary | ICD-10-CM

## 2020-02-04 DIAGNOSIS — O99892 Other specified diseases and conditions complicating childbirth: Secondary | ICD-10-CM | POA: Insufficient documentation

## 2020-02-04 DIAGNOSIS — O26892 Other specified pregnancy related conditions, second trimester: Secondary | ICD-10-CM | POA: Insufficient documentation

## 2020-02-04 DIAGNOSIS — Z3A24 24 weeks gestation of pregnancy: Secondary | ICD-10-CM | POA: Diagnosis not present

## 2020-02-04 DIAGNOSIS — R109 Unspecified abdominal pain: Secondary | ICD-10-CM | POA: Diagnosis not present

## 2020-02-04 DIAGNOSIS — R6889 Other general symptoms and signs: Secondary | ICD-10-CM

## 2020-02-04 DIAGNOSIS — R1033 Periumbilical pain: Secondary | ICD-10-CM

## 2020-02-04 LAB — COMPREHENSIVE METABOLIC PANEL
ALT: 10 U/L (ref 0–44)
AST: 12 U/L — ABNORMAL LOW (ref 15–41)
Albumin: 3.4 g/dL — ABNORMAL LOW (ref 3.5–5.0)
Alkaline Phosphatase: 41 U/L (ref 38–126)
Anion gap: 8 (ref 5–15)
BUN: 9 mg/dL (ref 6–20)
CO2: 22 mmol/L (ref 22–32)
Calcium: 8.9 mg/dL (ref 8.9–10.3)
Chloride: 106 mmol/L (ref 98–111)
Creatinine, Ser: 0.51 mg/dL (ref 0.44–1.00)
GFR, Estimated: >60 mL/min (ref >60)
Glucose, Bld: 85 mg/dL (ref 70–99)
Potassium: 3.8 mmol/L (ref 3.5–5.1)
Sodium: 136 mmol/L (ref 135–145)
Total Bilirubin: 0.5 mg/dL (ref 0.3–1.2)
Total Protein: 6.7 g/dL (ref 6.5–8.1)

## 2020-02-04 LAB — CBC
HCT: 30.8 % — ABNORMAL LOW (ref 36.0–46.0)
Hemoglobin: 10.4 g/dL — ABNORMAL LOW (ref 12.0–15.0)
MCH: 28.7 pg (ref 26.0–34.0)
MCHC: 33.8 g/dL (ref 30.0–36.0)
MCV: 85.1 fL (ref 80.0–100.0)
Platelets: 242 10*3/uL (ref 150–400)
RBC: 3.62 MIL/uL — ABNORMAL LOW (ref 3.87–5.11)
RDW: 13.7 % (ref 11.5–15.5)
WBC: 8.5 10*3/uL (ref 4.0–10.5)
nRBC: 0 % (ref 0.0–0.2)

## 2020-02-04 LAB — URINALYSIS, ROUTINE W REFLEX MICROSCOPIC
Bilirubin Urine: NEGATIVE
Glucose, UA: NEGATIVE mg/dL
Hgb urine dipstick: NEGATIVE
Ketones, ur: 5 mg/dL — AB
Leukocytes,Ua: NEGATIVE
Nitrite: NEGATIVE
Protein, ur: NEGATIVE mg/dL
Specific Gravity, Urine: 1.011 (ref 1.005–1.030)
pH: 6 (ref 5.0–8.0)

## 2020-02-04 LAB — URINALYSIS, COMPLETE (UACMP) WITH MICROSCOPIC
Bilirubin Urine: NEGATIVE
Glucose, UA: NEGATIVE mg/dL
Hgb urine dipstick: NEGATIVE
Ketones, ur: NEGATIVE mg/dL
Leukocytes,Ua: NEGATIVE
Nitrite: NEGATIVE
Protein, ur: NEGATIVE mg/dL
Specific Gravity, Urine: 1.012 (ref 1.005–1.030)
pH: 6 (ref 5.0–8.0)

## 2020-02-04 LAB — I-STAT BETA HCG BLOOD, ED (MC, WL, AP ONLY): I-stat hCG, quantitative: >2000 m[IU]/mL — ABNORMAL HIGH (ref <5)

## 2020-02-04 LAB — LIPASE, BLOOD: Lipase: 24 U/L (ref 11–51)

## 2020-02-04 NOTE — Progress Notes (Signed)
G2P1 at 24 3/7 days reports to [facility] for c/o abdominal pain that comes and goes since 10am this morning and shooting pain down left leg and lower back pain x 2 days. No bleeding or leaking.   No intercourse since last weekend.  No abdominal trauma noted. Monitors placed on pt.  Abdomen palpates soft and nontender. Sees [facility] for [facility].

## 2020-02-04 NOTE — ED Provider Notes (Signed)
[facility]-EMERGENCY DEPT Provider Note   CSN: [id number] Arrival date & time: 02/04/20  1115     History Chief Complaint  Patient presents with  . Abdominal Pain  . [redacted] weeks pregnant    Brooke Robinson is a 19 y.o. female who presents with multiple complaints. She is  G2 P1001 and about [redacted] weeks pregnant. She works on her feet all day. She has been having pain in her lower back from standing and moving boxes all day at her job. She also has intermittent sharp pain radiating down the front of her left leg which is new worse when sitting for long periods of time or when she is on her feet for long periods of time. Today she felt a sharp pain in her abdomen. She states that it was worse but is improving, Nothing seems to make it worse or better. She was not lifting anything heavy. She denies a urinary sxs, fever, or fluid from her vagina. She denies feelings of contractions.  She feels baby steadily moving   HPI     Past Medical History:  Diagnosis Date  . Herpes   . Hypertension   . Seasonal allergies     Patient Active Problem List   Diagnosis Date Noted  . Pregnant 08/27/2018    Past Surgical History:  Procedure Laterality Date  . TONSILLECTOMY       OB History    Gravida  2   Para  1   Term  1   Preterm      AB      Living  1     SAB      TAB      Ectopic      Multiple  0   Live Births  1           Family History  Problem Relation Age of Onset  . Healthy Mother     Social History   Tobacco Use  . Smoking status: Never Smoker  . Smokeless tobacco: Never Used  Vaping Use  . Vaping Use: Never used  Substance Use Topics  . Alcohol use: Never  . Drug use: Never    Home Medications Prior to Admission medications   Medication Sig Start Date End Date Taking? Authorizing Provider  acyclovir (ZOVIRAX) 400 MG tablet TAKE 1 TABLET BY MOUTH TWICE DAILY FOR SUPPRESSION THERAPY. 11/03/18   [provider]    levonorgestrel (MIRENA, 52 MG,) 20 MCG/24HR IUD Mirena 20 mcg/24 hours (6 yrs) 52 mg intrauterine device  Take 1 device by intrauterine route.    [provider]  naproxen (NAPROSYN) 500 MG tablet Take 1 tablet (500 mg total) by mouth 2 (two) times daily. 03/22/19   Eustace Moore, MD  ondansetron (ZOFRAN ODT) 4 MG disintegrating tablet Take 1 tablet (4 mg total) by mouth every 8 (eight) hours as needed. 09/07/19   McDonald, Mia A, PA-C  Prenatal Vit-Fe Fumarate-FA (MULTIVITAMIN-PRENATAL) 27-0.8 MG TABS tablet Take 1 tablet by mouth daily at 12 noon.    [provider]  fluticasone (FLONASE ALLERGY RELIEF) 50 MCG/ACT nasal spray Place 2 sprays into the nose daily as needed for allergies.   01/18/19  [provider]    Allergies    Patient has no known allergies.  Review of Systems   Review of Systems Ten systems reviewed and are negative for acute change, except as noted in the HPI.   Physical Exam Updated Vital Signs BP 118/76 (BP  Location: Left Arm)   Pulse 73   Temp 98.2 F (36.8 C) (Oral)   Resp 18   Ht 5\' 5"  (1.651 m)   Wt 70.3 kg   LMP 08/18/2019   SpO2 100%   BMI 25.79 kg/m   Physical Exam Vitals and nursing note reviewed.  Constitutional:      General: She is not in acute distress.    Appearance: She is well-developed. She is not diaphoretic.  HENT:     Head: Normocephalic and atraumatic.  Eyes:     General: No scleral icterus.    Conjunctiva/sclera: Conjunctivae normal.  Cardiovascular:     Rate and Rhythm: Normal rate and regular rhythm.     Heart sounds: Normal heart sounds. No murmur heard.  No friction rub. No gallop.   Pulmonary:     Effort: Pulmonary effort is normal. No respiratory distress.     Breath sounds: Normal breath sounds.  Abdominal:     General: Bowel sounds are normal. There is no distension.     Palpations: Abdomen is soft. There is no mass.     Tenderness: There is abdominal tenderness. There is no guarding.      Hernia: A hernia is present.     Comments: Gravid abdomen Uterine fundus feels to be several cm below the umbilicus.  Musculoskeletal:     Cervical back: Normal range of motion.     Comments: No midline tenderness Mild BL lumbar paraspinal muscles  Skin:    General: Skin is warm and dry.  Neurological:     Mental Status: She is alert and oriented to person, place, and time.  Psychiatric:        Behavior: Behavior normal.     ED Results / Procedures / Treatments   Labs (all labs ordered are listed, but only abnormal results are displayed) Labs Reviewed  COMPREHENSIVE METABOLIC PANEL - Abnormal; Notable for the following components:      Result Value   Albumin 3.4 (*)    AST 12 (*)    All other components within normal limits  CBC - Abnormal; Notable for the following components:   RBC 3.62 (*)    Hemoglobin 10.4 (*)    HCT 30.8 (*)    All other components within normal limits  URINALYSIS, ROUTINE W REFLEX MICROSCOPIC - Abnormal; Notable for the following components:   Ketones, ur 5 (*)    All other components within normal limits  I-STAT BETA HCG BLOOD, ED ([facility], [facility], [facility] ONLY) - Abnormal; Notable for the following components:   I-stat hCG, quantitative >2,000.0 (*)    All other components within normal limits  LIPASE, BLOOD    EKG None  Radiology No results found.  Procedures Procedures (including critical care time)  Medications Ordered in ED Medications - No data to display  ED Course  I have reviewed the triage vital signs and the nursing notes.  Pertinent labs & imaging results that were available during my care of the patient were reviewed by me and considered in my medical decision making (see chart for details).    MDM Rules/Calculators/A&P                          19 year old female [redacted] weeks pregnant here with complaint of abdominal pain, back pain, pain in her thigh, pain after standing for long periods of time, ankle swelling.  After evaluation  patient has no obvious obstructed hernia, minimal tenderness.  I  ordered and reviewed patient's labs which shows normal urinalysis, lipase within normal limits, CBC with mild normocytic anemia, CMP without abnormality.  Patient is seen by OB rapid response and cleared through OB.  Patient at the end of work-up states that she just really wanted a note restricting her from standing for long periods of time at her job.  I discussed that she will need to get this from her OB/GYN.  Do not think she has any emergent cause of her complaints.  Think these are all related to her being pregnant.  Patient appears otherwise appropriate for discharge at this time. Final Clinical Impression(s) / ED Diagnoses Final diagnoses:  None    Rx / DC Orders ED Discharge Orders    None       Arthor Captain, PA-C 02/04/20 2228    Jacalyn Lefevre, MD 02/04/20 2247

## 2020-02-04 NOTE — Discharge Instructions (Signed)
SEEK IMMEDIATE MEDICAL ATTENTION IF: New numbness, tingling, weakness, or problem with the use of your arms or legs.  Severe back pain not relieved with medications.  Change in bowel or bladder control.  Increasing pain in any areas of the body (such as chest or abdominal pain).  Shortness of breath, dizziness or fainting.  Nausea (feeling sick to your stomach), vomiting, fever, or sweats.  

## 2020-02-04 NOTE — Progress Notes (Signed)
Dr Tenny Craw updated on pt status.  Send UA to rule out urinary tract infection.  Call office on Monday to obtain an appt next week from her scheduled appt on 12/17.  Cleared from [facility].  Told to call office this weekend if she has questions/concerns.

## 2020-02-04 NOTE — ED Triage Notes (Addendum)
patient states she is [redacted] weeks pregnant. Patient c/o intermittent mid abdominal pain and states constant pain in her pelvis x 2 days. Patient states the pain radiates into the left thigh.   Patient added during triage that she was also experiencing lower back pain

## 2020-02-06 LAB — URINE CULTURE
Culture: NO GROWTH
Special Requests: NORMAL

## 2020-03-04 NOTE — L&D Delivery Note (Signed)
Delivery Note At 7:44 PM a viable and healthy female was delivered via Vaginal, Spontaneous (Presentation: Right Occiput Anterior).  APGAR: 9, 9; weight pending .   Placenta status: Spontaneous, Intact.  Cord: 3 vessels   Pt pushed with 2 contractions and delivered a vigorous female infant in the vertex ROA presentation with apgars of 9 and 9. Following a 1 minute delay the umbilical cord was clamped and cut. The placenta delivered spontaneously, intact, with 3VC. No lacerations required repair. Mom and baby are doing well following delivery  Anesthesia: Epidural Episiotomy: None Lacerations: None Suture Repair: NA Est. Blood Loss (mL): 102  Mom to postpartum.  Baby to Couplet care / Skin to Skin.  Waynard Reeds 05/15/2020, 7:54 PM

## 2020-03-27 DIAGNOSIS — O99019 Anemia complicating pregnancy, unspecified trimester: Secondary | ICD-10-CM | POA: Diagnosis not present

## 2020-03-27 DIAGNOSIS — D573 Sickle-cell trait: Secondary | ICD-10-CM | POA: Diagnosis not present

## 2020-04-05 DIAGNOSIS — Z1152 Encounter for screening for COVID-19: Secondary | ICD-10-CM | POA: Diagnosis not present

## 2020-04-12 ENCOUNTER — Encounter (HOSPITAL_COMMUNITY): Payer: Self-pay | Admitting: Obstetrics and Gynecology

## 2020-04-12 ENCOUNTER — Inpatient Hospital Stay (HOSPITAL_COMMUNITY)
Admission: AD | Admit: 2020-04-12 | Discharge: 2020-04-12 | Disposition: A | Discharge status: Home / Self Care | Payer: Medicaid Other | Attending: Obstetrics and Gynecology | Admitting: Obstetrics and Gynecology

## 2020-04-12 ENCOUNTER — Other Ambulatory Visit: Payer: Self-pay

## 2020-04-12 DIAGNOSIS — O99891 Other specified diseases and conditions complicating pregnancy: Secondary | ICD-10-CM | POA: Diagnosis not present

## 2020-04-12 DIAGNOSIS — R000 Tachycardia, unspecified: Secondary | ICD-10-CM | POA: Insufficient documentation

## 2020-04-12 DIAGNOSIS — Z3A34 34 weeks gestation of pregnancy: Secondary | ICD-10-CM | POA: Diagnosis not present

## 2020-04-12 DIAGNOSIS — O99413 Diseases of the circulatory system complicating pregnancy, third trimester: Secondary | ICD-10-CM | POA: Insufficient documentation

## 2020-04-12 DIAGNOSIS — Z789 Other specified health status: Secondary | ICD-10-CM

## 2020-04-12 LAB — URINALYSIS, ROUTINE W REFLEX MICROSCOPIC
Bilirubin Urine: NEGATIVE
Glucose, UA: NEGATIVE mg/dL
Hgb urine dipstick: NEGATIVE
Ketones, ur: NEGATIVE mg/dL
Leukocytes,Ua: NEGATIVE
Nitrite: NEGATIVE
Protein, ur: NEGATIVE mg/dL
Specific Gravity, Urine: 1.015 (ref 1.005–1.030)
pH: 6.5 (ref 5.0–8.0)

## 2020-04-12 NOTE — [facility] Note (Signed)
An hour ago while she was eating she had an episode of feeling her heart racing that has been constant since then.  Denies chest pain or SOB, no hx of cardiac issues.  Denies ever feeling this way before.  Still feeling it now.  Denies LOF/VB/CTX.  Endorses + FM.  Denies complications w/ her pregnancy.

## 2020-04-12 NOTE — Discharge Instructions (Signed)
Sinus Tachycardia  Sinus tachycardia is a kind of fast heartbeat. In sinus tachycardia, the heart beats more than 100 times a minute. Sinus tachycardia starts in a part of the heart called the sinus node. Sinus tachycardia may be harmless, or it may be a sign of a serious condition. What are the causes? This condition may be caused by:  Exercise or exertion.  A fever.  Pain.  Loss of body fluids (dehydration).  Severe bleeding (hemorrhage).  Anxiety and stress.  Certain substances, including: ? Alcohol. ? Caffeine. ? Tobacco and nicotine products. ? Cold medicines. ? Illegal drugs.  Medical conditions including: ? Heart disease. ? An infection. ? An overactive thyroid (hyperthyroidism). ? A lack of red blood cells (anemia). What are the signs or symptoms? Symptoms of this condition include:  A feeling that the heart is beating quickly (palpitations).  Suddenly noticing your heartbeat (cardiac awareness).  Dizziness.  Tiredness (fatigue).  Shortness of breath.  Chest pain.  Nausea.  Fainting. How is this diagnosed? This condition is diagnosed with:  A physical exam.  Other tests, such as: ? Blood tests. ? An electrocardiogram (ECG). This test measures the electrical activity of the heart. ? Ambulatory cardiac monitor. This records your heartbeats for 24 hours or more. You may be referred to a heart specialist (cardiologist). How is this treated? Treatment for this condition depends on the cause or the underlying condition. Treatment may involve:  Treating the underlying condition.  Taking new medicines or changing your current medicines as told by your health care provider.  Making changes to your diet or lifestyle. Follow these instructions at home: Lifestyle  Do not use any products that contain nicotine or tobacco, such as cigarettes and e-cigarettes. If you need help quitting, ask your health care provider.  Do not use illegal drugs, such as  cocaine.  Learn relaxation methods to help you when you get stressed or anxious. These include deep breathing.  Avoid caffeine or other stimulants.   Alcohol use  Do not drink alcohol if: ? Your health care provider tells you not to drink. ? You are pregnant, may be pregnant, or are planning to become pregnant.  If you drink alcohol, limit how much you have: ? 0-1 drink a day for women. ? 0-2 drinks a day for men.  Be aware of how much alcohol is in your drink. In the [place] one drink equals one typical bottle of beer (12 oz), one-half glass of wine (5 oz), or one shot of hard liquor (1 oz).   General instructions  Drink enough fluids to keep your urine pale yellow.  Take over-the-counter and prescription medicines only as told by your health care provider.  Keep all follow-up visits as told by your health care provider. This is important. Contact a health care provider if you have:  A fever.  Vomiting or diarrhea that does not go away. Get help right away if you:  Have pain in your chest, upper arms, jaw, or neck.  Become weak or dizzy.  Feel faint.  Have palpitations that do not go away. Summary  In sinus tachycardia, the heart beats more than 100 times a minute.  Sinus tachycardia may be harmless, or it may be a sign of a serious condition.  Treatment for this condition depends on the cause or the underlying condition.  Get help right away if you have pain in your chest, upper arms, jaw, or neck. This information is not intended to replace advice given to   you by your health care provider. Make sure you discuss any questions you have with your health care provider. Document Revised: 04/09/2017 Document Reviewed: 04/09/2017 Elsevier Patient Education  2021 Elsevier Inc.  

## 2020-04-12 NOTE — MAU Provider Note (Addendum)
Patient Brooke Robinson is a 20 y.o. G2P1001  at [redacted]w[redacted]d here with complaints of elevated heart rate an hour ago. She denies contractions, LOF, decreased fetal movements, vaginal bleeding, abnormal discharge, dysuria.   She does not drink energy drinks or coffee.  She denies any history of anxiety. She denies heartburn, SOB, fever, chest pain, difficulties breathing. She feels otherwise well.   History     CSN: [id number]  Arrival date and time: 04/12/20 1524   None     Chief Complaint  Patient presents with  . Tachycardia   HPI Patient reports that her heart rate started going fast at 2:30 while eating pizza. She says it "came out of nowhere". She reports that she felt dizzy, lightheaded, and and like she was going to pass out. She still feels like her heart is racing. She does not take any medicine.  OB History    Gravida  2   Para  1   Term  1   Preterm      AB      Living  1     SAB      IAB      Ectopic      Multiple  0   Live Births  1           Past Medical History:  Diagnosis Date  . Herpes   . Hypertension   . Seasonal allergies     Past Surgical History:  Procedure Laterality Date  . TONSILLECTOMY      Family History  Problem Relation Age of Onset  . Healthy Mother     Social History   Tobacco Use  . Smoking status: Never Smoker  . Smokeless tobacco: Never Used  Vaping Use  . Vaping Use: Never used  Substance Use Topics  . Alcohol use: Never  . Drug use: Never    Allergies: No Known Allergies  Medications Prior to Admission  Medication Sig Dispense Refill Last Dose  . acyclovir (ZOVIRAX) 400 MG tablet TAKE 1 TABLET BY MOUTH TWICE DAILY FOR SUPPRESSION THERAPY.   04/11/2020 at Unknown time  . Prenatal Vit-Fe Fumarate-FA (MULTIVITAMIN-PRENATAL) 27-0.8 MG TABS tablet Take 1 tablet by mouth daily at 12 noon.   04/11/2020 at Unknown time  . levonorgestrel (MIRENA, 52 MG,) 20 MCG/24HR IUD Mirena 20 mcg/24 hours (6 yrs) 52 mg intrauterine  device  Take 1 device by intrauterine route.     . naproxen (NAPROSYN) 500 MG tablet Take 1 tablet (500 mg total) by mouth 2 (two) times daily. 30 tablet 0   . ondansetron (ZOFRAN ODT) 4 MG disintegrating tablet Take 1 tablet (4 mg total) by mouth every 8 (eight) hours as needed. 20 tablet 0     Review of Systems  Constitutional: Negative.   HENT: Negative.   Respiratory: Negative.  Negative for cough, chest tightness, shortness of breath and wheezing.   Gastrointestinal: Negative.   Genitourinary: Negative.   Musculoskeletal: Negative.   Neurological: Negative.   Psychiatric/Behavioral: Negative.    Physical Exam   Blood pressure 124/72, pulse 74, temperature 98.8 F (37.1 C), temperature source Oral, resp. rate 16, last menstrual period 08/18/2019, SpO2 97 %, unknown if currently breastfeeding.  Physical Exam Constitutional:      Appearance: Normal appearance.  HENT:     Head: Normocephalic.  Cardiovascular:     Rate and Rhythm: Normal rate and regular rhythm.     Heart sounds: Normal heart sounds. No murmur heard. No friction rub. No gallop.  Pulmonary:     Effort: Pulmonary effort is normal.  Abdominal:     General: Abdomen is flat.  Musculoskeletal:        General: Normal range of motion.  Skin:    General: Skin is warm and dry.  Neurological:     Mental Status: She is alert.     MAU Course  Procedures  MDM -NST; 135 bpm, mod var, present acel, no decels, no contractions -EKG with normal sinus rhythm -no other complaints while in MAU, continuous pulse ox shows no evidence of tachycardia. Patient was ambulating, talking, laughing without difficulty in MAU. No urgent or emergent concerns identified. Patient took a nap while in MAU.  Patient Vitals for the past 24 hrs:  BP Temp Temp src Pulse Resp SpO2  04/12/20 1605 - - - - - 97 %  04/12/20 1603 124/72 - - 74 16 -  04/12/20 1600 - - - - - 98 %  04/12/20 1543 123/73 98.8 F (37.1 C) Oral 79 17 98 %       Assessment and Plan   1. Normal pulse    -Patient stable for discharge -Keep follow-up appt on 02/11 -Go to [facility] if any changes in heart rate or concern for arrhtymnia . Consider cardiology referral if continues to have runs of tachycardia.  -All questions answered.  -Work note given for patient to return to work [employer] 04/12/2020, 4:23 PM

## 2020-04-14 DIAGNOSIS — O26843 Uterine size-date discrepancy, third trimester: Secondary | ICD-10-CM | POA: Diagnosis not present

## 2020-04-28 DIAGNOSIS — Z113 Encounter for screening for infections with a predominantly sexual mode of transmission: Secondary | ICD-10-CM | POA: Diagnosis not present

## 2020-04-28 DIAGNOSIS — Z3480 Encounter for supervision of other normal pregnancy, unspecified trimester: Secondary | ICD-10-CM | POA: Diagnosis not present

## 2020-04-28 LAB — OB RESULTS CONSOLE GC/CHLAMYDIA
Chlamydia: NEGATIVE
Gonorrhea: NEGATIVE

## 2020-04-28 LAB — OB RESULTS CONSOLE GBS: GBS: NEGATIVE

## 2020-05-03 ENCOUNTER — Encounter (HOSPITAL_COMMUNITY): Payer: Self-pay | Admitting: *Deleted

## 2020-05-03 ENCOUNTER — Telehealth (HOSPITAL_COMMUNITY): Payer: Self-pay | Admitting: *Deleted

## 2020-05-03 NOTE — Telephone Encounter (Signed)
Preadmission screen  

## 2020-05-04 ENCOUNTER — Encounter (HOSPITAL_COMMUNITY): Payer: Self-pay | Admitting: *Deleted

## 2020-05-10 ENCOUNTER — Encounter (HOSPITAL_COMMUNITY): Payer: Self-pay | Admitting: Obstetrics and Gynecology

## 2020-05-10 ENCOUNTER — Inpatient Hospital Stay (HOSPITAL_COMMUNITY)
Admission: AD | Admit: 2020-05-10 | Discharge: 2020-05-10 | Disposition: A | Discharge status: Home / Self Care | Payer: BC Managed Care – PPO | Attending: Obstetrics and Gynecology | Admitting: Obstetrics and Gynecology

## 2020-05-10 DIAGNOSIS — Z3A38 38 weeks gestation of pregnancy: Secondary | ICD-10-CM | POA: Diagnosis not present

## 2020-05-10 DIAGNOSIS — O471 False labor at or after 37 completed weeks of gestation: Secondary | ICD-10-CM | POA: Diagnosis not present

## 2020-05-10 DIAGNOSIS — O479 False labor, unspecified: Secondary | ICD-10-CM

## 2020-05-10 NOTE — MAU Note (Signed)
I have communicated with Wynelle Bourgeois, CNM and reviewed vital signs:   Vitals:   05/10/20 0712 05/10/20 0748  BP: 125/70 132/74  Pulse: 90 62  Resp:    Temp:    SpO2:  99%    Vaginal exam:  Dilation: 1 Effacement (%): Thick Cervical Position: Posterior Station: Ballotable Presentation: Vertex Exam by:: Erle Crocker, RN,   Also reviewed contraction pattern and that non-stress test is reactive.  It has been documented that patient is contracting every 4-6 minutes with no cervical change over 1 hour not indicating active labor.  Patient denies any other complaints.  Based on this report provider has given order for discharge.  A discharge order and diagnosis entered by a provider.   Labor discharge instructions reviewed with patient.

## 2020-05-10 NOTE — MAU Provider Note (Signed)
S: Ms. Brooke Robinson is a 20 y.o. G2P1001 at [redacted]w[redacted]d  who presents to MAU today for labor evaluation.     Cervical exam by RN:  Dilation: 1 Effacement (%): Thick Cervical Position: Posterior Station: Ballotable Presentation: Vertex Exam by:: Karl Ito, rnc  Fetal Monitoring: Baseline: 140 Variability:mod  Accelerations: present Decelerations: absent Contractions:   MDM Discussed patient with RN. NST reviewed.   A: [facility] at [redacted]w[redacted]d  False labor  P: Discharge home Labor precautions and kick counts included in AVS Patient to follow-up with [facility] as  scheduled  Patient may return to MAU as needed or when in labor   Marylene Land, [place] 05/10/2020 8:47 AM

## 2020-05-10 NOTE — MAU Note (Signed)
Contractions since 0330, now stronger and every 2 mins apart. Denies bleeding or leaking of fluid.

## 2020-05-10 NOTE — MAU Note (Signed)
Patient signed printed AVS. Placed in chart. 

## 2020-05-13 ENCOUNTER — Other Ambulatory Visit (HOSPITAL_COMMUNITY): Admission: RE | Admit: 2020-05-13 | Payer: BC Managed Care – PPO | Source: Ambulatory Visit

## 2020-05-15 ENCOUNTER — Inpatient Hospital Stay (HOSPITAL_COMMUNITY): Payer: BC Managed Care – PPO | Admitting: Anesthesiology

## 2020-05-15 ENCOUNTER — Inpatient Hospital Stay (HOSPITAL_COMMUNITY): Payer: BC Managed Care – PPO

## 2020-05-15 ENCOUNTER — Encounter (HOSPITAL_COMMUNITY): Payer: Self-pay | Admitting: Obstetrics

## 2020-05-15 ENCOUNTER — Inpatient Hospital Stay (HOSPITAL_COMMUNITY)
Admission: AD | Admit: 2020-05-15 | Discharge: 2020-05-17 | DRG: 807 | Disposition: A | Discharge status: Home / Self Care | Payer: BC Managed Care – PPO | Attending: Obstetrics and Gynecology | Admitting: Obstetrics and Gynecology

## 2020-05-15 ENCOUNTER — Other Ambulatory Visit: Payer: Self-pay

## 2020-05-15 DIAGNOSIS — Z20822 Contact with and (suspected) exposure to covid-19: Secondary | ICD-10-CM | POA: Diagnosis present

## 2020-05-15 DIAGNOSIS — O1002 Pre-existing essential hypertension complicating childbirth: Secondary | ICD-10-CM | POA: Diagnosis not present

## 2020-05-15 DIAGNOSIS — Z3A38 38 weeks gestation of pregnancy: Secondary | ICD-10-CM | POA: Diagnosis not present

## 2020-05-15 DIAGNOSIS — O10013 Pre-existing essential hypertension complicating pregnancy, third trimester: Secondary | ICD-10-CM | POA: Diagnosis present

## 2020-05-15 LAB — COMPREHENSIVE METABOLIC PANEL
ALT: 11 U/L (ref 0–44)
AST: 15 U/L (ref 15–41)
Albumin: 2.8 g/dL — ABNORMAL LOW (ref 3.5–5.0)
Alkaline Phosphatase: 88 U/L (ref 38–126)
Anion gap: 8 (ref 5–15)
BUN: 5 mg/dL — ABNORMAL LOW (ref 6–20)
CO2: 21 mmol/L — ABNORMAL LOW (ref 22–32)
Calcium: 9.2 mg/dL (ref 8.9–10.3)
Chloride: 106 mmol/L (ref 98–111)
Creatinine, Ser: 0.66 mg/dL (ref 0.44–1.00)
GFR, Estimated: >60 mL/min (ref >60)
Glucose, Bld: 82 mg/dL (ref 70–99)
Potassium: 3.7 mmol/L (ref 3.5–5.1)
Sodium: 135 mmol/L (ref 135–145)
Total Bilirubin: 0.5 mg/dL (ref 0.3–1.2)
Total Protein: 6 g/dL — ABNORMAL LOW (ref 6.5–8.1)

## 2020-05-15 LAB — TYPE AND SCREEN
ABO/RH(D): A POS
Antibody Screen: NEGATIVE

## 2020-05-15 LAB — CBC WITH DIFFERENTIAL/PLATELET
Abs Immature Granulocytes: 0.19 10*3/uL — ABNORMAL HIGH (ref 0.00–0.07)
Basophils Absolute: 0 10*3/uL (ref 0.0–0.1)
Basophils Relative: 0 %
Eosinophils Absolute: 0 10*3/uL (ref 0.0–0.5)
Eosinophils Relative: 0 %
HCT: 35.7 % — ABNORMAL LOW (ref 36.0–46.0)
Hemoglobin: 12.2 g/dL (ref 12.0–15.0)
Immature Granulocytes: 1 %
Lymphocytes Relative: 10 %
Lymphs Abs: 1.4 10*3/uL (ref 0.7–4.0)
MCH: 28.7 pg (ref 26.0–34.0)
MCHC: 34.2 g/dL (ref 30.0–36.0)
MCV: 84 fL (ref 80.0–100.0)
Monocytes Absolute: 0.7 10*3/uL (ref 0.1–1.0)
Monocytes Relative: 5 %
Neutro Abs: 11.6 10*3/uL — ABNORMAL HIGH (ref 1.7–7.7)
Neutrophils Relative %: 84 %
Platelets: 257 10*3/uL (ref 150–400)
RBC: 4.25 MIL/uL (ref 3.87–5.11)
RDW: 13.9 % (ref 11.5–15.5)
WBC: 14.1 10*3/uL — ABNORMAL HIGH (ref 4.0–10.5)
nRBC: 0 % (ref 0.0–0.2)

## 2020-05-15 LAB — CBC
HCT: 32.7 % — ABNORMAL LOW (ref 36.0–46.0)
Hemoglobin: 11.6 g/dL — ABNORMAL LOW (ref 12.0–15.0)
MCH: 29.3 pg (ref 26.0–34.0)
MCHC: 35.5 g/dL (ref 30.0–36.0)
MCV: 82.6 fL (ref 80.0–100.0)
Platelets: 256 10*3/uL (ref 150–400)
RBC: 3.96 MIL/uL (ref 3.87–5.11)
RDW: 13.9 % (ref 11.5–15.5)
WBC: 9.5 10*3/uL (ref 4.0–10.5)
nRBC: 0 % (ref 0.0–0.2)

## 2020-05-15 LAB — RPR: RPR Ser Ql: NONREACTIVE

## 2020-05-15 LAB — SARS CORONAVIRUS 2 (TAT 6-24 HRS): SARS Coronavirus 2: NEGATIVE

## 2020-05-15 MED ORDER — MISOPROSTOL 25 MCG QUARTER TABLET
25.0000 ug | ORAL_TABLET | ORAL | Status: DC | PRN
  Administered 2020-05-15 (×3): 25 ug via VAGINAL
  Filled 2020-05-15 (×3): qty 1

## 2020-05-15 MED ORDER — ACETAMINOPHEN 325 MG PO TABS: 650.0000 mg | ORAL_TABLET | ORAL | Status: DC | PRN

## 2020-05-15 MED ORDER — OXYTOCIN-SODIUM CHLORIDE 30-0.9 UT/500ML-% IV SOLN
2.5000 [IU]/h | INTRAVENOUS | Status: DC
  Administered 2020-05-15: 2.5 [IU]/h via INTRAVENOUS
  Filled 2020-05-15: qty 500

## 2020-05-15 MED ORDER — LIDOCAINE HCL (PF) 1 % IJ SOLN: 30.0000 mL | INTRAMUSCULAR | Status: DC | PRN

## 2020-05-15 MED ORDER — LACTATED RINGERS IV SOLN: INTRAVENOUS | Status: DC

## 2020-05-15 MED ORDER — COCONUT OIL OIL
1.0000 "application " | TOPICAL_OIL | Status: DC | PRN
  Administered 2020-05-16: 1 via TOPICAL

## 2020-05-15 MED ORDER — IBUPROFEN 600 MG PO TABS
600.0000 mg | ORAL_TABLET | Freq: Four times a day (QID) | ORAL | Status: DC
  Administered 2020-05-15 – 2020-05-17 (×6): 600 mg via ORAL
  Filled 2020-05-15 (×6): qty 1

## 2020-05-15 MED ORDER — PHENYLEPHRINE 40 MCG/ML (10ML) SYRINGE FOR IV PUSH (FOR BLOOD PRESSURE SUPPORT): 80.0000 ug | PREFILLED_SYRINGE | INTRAVENOUS | Status: DC | PRN

## 2020-05-15 MED ORDER — SOD CITRATE-CITRIC ACID 500-334 MG/5ML PO SOLN: 30.0000 mL | ORAL | Status: DC | PRN

## 2020-05-15 MED ORDER — PRENATAL MULTIVITAMIN CH
1.0000 | ORAL_TABLET | Freq: Every day | ORAL | Status: DC
  Administered 2020-05-16: 1 via ORAL
  Filled 2020-05-15: qty 1

## 2020-05-15 MED ORDER — OXYTOCIN BOLUS FROM INFUSION
333.0000 mL | Freq: Once | INTRAVENOUS | Status: AC
  Administered 2020-05-15: 333 mL via INTRAVENOUS

## 2020-05-15 MED ORDER — LACTATED RINGERS IV SOLN: 500.0000 mL | INTRAVENOUS | Status: DC | PRN

## 2020-05-15 MED ORDER — LACTATED RINGERS IV SOLN: 500.0000 mL | Freq: Once | INTRAVENOUS | Status: DC

## 2020-05-15 MED ORDER — FENTANYL CITRATE (PF) 100 MCG/2ML IJ SOLN
50.0000 ug | INTRAMUSCULAR | Status: DC | PRN
  Administered 2020-05-15 (×2): 100 ug via INTRAVENOUS
  Administered 2020-05-15: 50 ug via INTRAVENOUS
  Filled 2020-05-15 (×3): qty 2

## 2020-05-15 MED ORDER — LIDOCAINE-EPINEPHRINE (PF) 2 %-1:200000 IJ SOLN
INTRAMUSCULAR | Status: DC | PRN
  Administered 2020-05-15: 4 mL via EPIDURAL

## 2020-05-15 MED ORDER — ONDANSETRON HCL 4 MG/2ML IJ SOLN: 4.0000 mg | Freq: Four times a day (QID) | INTRAMUSCULAR | Status: DC | PRN

## 2020-05-15 MED ORDER — DIPHENHYDRAMINE HCL 25 MG PO CAPS
25.0000 mg | ORAL_CAPSULE | Freq: Four times a day (QID) | ORAL | Status: DC | PRN
Start: 2020-05-15 — End: 2020-05-17

## 2020-05-15 MED ORDER — WITCH HAZEL-GLYCERIN EX PADS: 1.0000 "application " | MEDICATED_PAD | CUTANEOUS | Status: DC | PRN

## 2020-05-15 MED ORDER — EPHEDRINE 5 MG/ML INJ: 10.0000 mg | INTRAVENOUS | Status: DC | PRN

## 2020-05-15 MED ORDER — BENZOCAINE-MENTHOL 20-0.5 % EX AERO
1.0000 "application " | INHALATION_SPRAY | CUTANEOUS | Status: DC | PRN
  Administered 2020-05-16: 1 via TOPICAL
  Filled 2020-05-15: qty 56

## 2020-05-15 MED ORDER — OXYTOCIN-SODIUM CHLORIDE 30-0.9 UT/500ML-% IV SOLN
1.0000 m[IU]/min | INTRAVENOUS | Status: DC
  Administered 2020-05-15: 2 m[IU]/min via INTRAVENOUS

## 2020-05-15 MED ORDER — SENNOSIDES-DOCUSATE SODIUM 8.6-50 MG PO TABS
2.0000 | ORAL_TABLET | Freq: Every day | ORAL | Status: DC
  Administered 2020-05-16: 2 via ORAL
  Filled 2020-05-15: qty 2

## 2020-05-15 MED ORDER — TERBUTALINE SULFATE 1 MG/ML IJ SOLN: 0.2500 mg | Freq: Once | INTRAMUSCULAR | Status: DC | PRN

## 2020-05-15 MED ORDER — TETANUS-DIPHTH-ACELL PERTUSSIS 5-2.5-18.5 LF-MCG/0.5 IM SUSY: 0.5000 mL | PREFILLED_SYRINGE | Freq: Once | INTRAMUSCULAR | Status: DC

## 2020-05-15 MED ORDER — ONDANSETRON HCL 4 MG/2ML IJ SOLN: 4.0000 mg | INTRAMUSCULAR | Status: DC | PRN

## 2020-05-15 MED ORDER — ZOLPIDEM TARTRATE 5 MG PO TABS: 5.0000 mg | ORAL_TABLET | Freq: Every evening | ORAL | Status: DC | PRN

## 2020-05-15 MED ORDER — OXYCODONE-ACETAMINOPHEN 5-325 MG PO TABS: 2.0000 | ORAL_TABLET | ORAL | Status: DC | PRN

## 2020-05-15 MED ORDER — OXYCODONE-ACETAMINOPHEN 5-325 MG PO TABS: 1.0000 | ORAL_TABLET | ORAL | Status: DC | PRN

## 2020-05-15 MED ORDER — DIPHENHYDRAMINE HCL 50 MG/ML IJ SOLN: 12.5000 mg | INTRAMUSCULAR | Status: DC | PRN

## 2020-05-15 MED ORDER — DIBUCAINE (PERIANAL) 1 % EX OINT: 1.0000 "application " | TOPICAL_OINTMENT | CUTANEOUS | Status: DC | PRN

## 2020-05-15 MED ORDER — OXYCODONE HCL 5 MG PO TABS: 10.0000 mg | ORAL_TABLET | ORAL | Status: DC | PRN

## 2020-05-15 MED ORDER — OXYCODONE HCL 5 MG PO TABS: 5.0000 mg | ORAL_TABLET | ORAL | Status: DC | PRN

## 2020-05-15 MED ORDER — SIMETHICONE 80 MG PO CHEW: 80.0000 mg | CHEWABLE_TABLET | ORAL | Status: DC | PRN

## 2020-05-15 MED ORDER — ONDANSETRON HCL 4 MG PO TABS: 4.0000 mg | ORAL_TABLET | ORAL | Status: DC | PRN

## 2020-05-15 MED ORDER — FENTANYL-BUPIVACAINE-NACL 0.5-0.125-0.9 MG/250ML-% EP SOLN
12.0000 mL/h | EPIDURAL | Status: DC | PRN
  Administered 2020-05-15: 12 mL/h via EPIDURAL
  Filled 2020-05-15: qty 250

## 2020-05-15 NOTE — Anesthesia Procedure Notes (Signed)
Epidural Patient location during procedure: OB Start time: 05/15/2020 6:40 PM End time: 05/15/2020 6:50 PM  Staffing Anesthesiologist: Elmer Picker, MD Performed: anesthesiologist   Preanesthetic Checklist Completed: patient identified, IV checked, risks and benefits discussed, monitors and equipment checked, pre-op evaluation and timeout performed  Epidural Patient position: sitting Prep: DuraPrep and site prepped and draped Patient monitoring: continuous pulse ox, blood pressure, heart rate and cardiac monitor Approach: midline Location: L3-L4 Injection technique: LOR air  Needle:  Needle type: Tuohy  Needle gauge: 17 G Needle length: 9 cm Needle insertion depth: 5 cm Catheter type: closed end flexible Catheter size: 19 Gauge Catheter at skin depth: 10 cm Test dose: negative  Assessment Sensory level: T8 Events: blood not aspirated, injection not painful, no injection resistance, no paresthesia and negative IV test  Additional Notes Patient identified. Risks/Benefits/Options discussed with patient including but not limited to bleeding, infection, nerve damage, paralysis, failed block, incomplete pain control, headache, blood pressure changes, nausea, vomiting, reactions to medication both or allergic, itching and postpartum back pain. Confirmed with bedside nurse the patient's most recent platelet count. Confirmed with patient that they are not currently taking any anticoagulation, have any bleeding history or any family history of bleeding disorders. Patient expressed understanding and wished to proceed. All questions were answered. Sterile technique was used throughout the entire procedure. Please see nursing notes for vital signs. Test dose was given through epidural catheter and negative prior to continuing to dose epidural or start infusion. Warning signs of high block given to the patient including shortness of breath, tingling/numbness in hands, complete motor block,  or any concerning symptoms with instructions to call for help. Patient was given instructions on fall risk and not to get out of bed. All questions and concerns addressed with instructions to call with any issues or inadequate analgesia.  Reason for block:procedure for pain

## 2020-05-15 NOTE — Lactation Note (Signed)
This note was copied from a baby's chart. Lactation Consultation Note  Patient Name: Girl Jaidyn Usery [id number] Date: 05/15/2020 Reason for consult: L&D Initial assessment;Early term 37-38.6wks Age:21 hours  L&D consult with >60 minutes old infant and P2 mother. Parents and grandmother are present at time of consult. Congratulated them on their newborn. Assisted with hand expression, collected ~53mL and spoonfed newborn. Infant is in warmer at the time. Discussed STS as ideal transition for infants after birth helping with temperature, blood sugar and comfort. Talked about primal reflexes such as rooting, hands to mouth, searching for the breast among others.   No latch assistance at this time. Explained LC services availability during postpartum stay. Thanked family for their time.    Maternal Data Does the patient have breastfeeding experience prior to this delivery?: Yes How long did the patient breastfeed?: 2 months x1  Feeding Mother's Current Feeding Choice: Breast Milk  Interventions Interventions: Breast massage;Hand express;Skin to skin;Expressed milk;Education  Discharge Pump: Personal WIC Program: Yes  Consult Status Consult Status: Follow-up Date: 05/15/20 Follow-up type: In-patient    Rein Popov A Higuera Ancidey 05/15/2020, 9:10 PM

## 2020-05-15 NOTE — H&P (Signed)
Brooke Robinson is a 20 y.o. female presenting for IOL for chronic hypertension  20 yo [id number]@ 38+6 presents for IOL for chronic hypertension not requiring medication. She has a history of hypertension outside of pregnancy. She has not required medication during this pregnancy.   OB History    Gravida  2   Para  1   Term  1   Preterm      AB      Living  1     SAB      IAB      Ectopic      Multiple  0   Live Births  1          Past Medical History:  Diagnosis Date  . Herpes   . Hypertension   . Seasonal allergies    Past Surgical History:  Procedure Laterality Date  . TONSILLECTOMY     Family History: family history includes Healthy in her mother. Social History:  reports that she has never smoked. She has never used smokeless tobacco. She reports that she does not drink alcohol and does not use drugs.     Maternal Diabetes: No Genetic Screening: Normal Maternal Ultrasounds/Referrals: Normal Fetal Ultrasounds or other Referrals:  None Maternal Substance Abuse:  No Significant Maternal Medications:  None Significant Maternal Lab Results:  None Other Comments:  None  Review of Systems History Dilation: 2 Effacement (%): 50 Station: -2 Exam by:: C Futures trader Blood pressure 125/72, pulse 70, temperature 97.9 F (36.6 C), temperature source Oral, resp. rate 16, height 5\' 5"  (1.651 m), weight 76.7 kg, last menstrual period 08/18/2019, unknown if currently breastfeeding. Exam Physical Exam  Prenatal labs: ABO, Rh: --/--/A POS (03/14 0035) Antibody: NEG (03/14 0035) Rubella: Immune (09/15 0000) RPR: NON REACTIVE (03/14 0035)  HBsAg: Negative (09/15 0000)  HIV: Non-reactive (09/15 0000)  GBS: Negative/-- (02/25 0000)   Assessment/Plan: 1) Admit 2) Cytotec PV Q 4hrs 3) AROM when able 4) epidural on request   08-15-1995 05/15/2020, 1:08 PM

## 2020-05-15 NOTE — Anesthesia Preprocedure Evaluation (Signed)
Anesthesia Evaluation  Patient identified by MRN, date of birth, ID band Patient awake    Reviewed: Allergy & Precautions, NPO status , Patient's Chart, lab work & pertinent test results  Airway Mallampati: II  TM Distance: >3 FB Neck ROM: Full    Dental no notable dental hx.    Pulmonary neg pulmonary ROS,    Pulmonary exam normal breath sounds clear to auscultation       Cardiovascular hypertension, Pt. on medications negative cardio ROS Normal cardiovascular exam Rhythm:Regular Rate:Normal     Neuro/Psych negative neurological ROS  negative psych ROS   GI/Hepatic negative GI ROS, Neg liver ROS,   Endo/Other  negative endocrine ROS  Renal/GU negative Renal ROS  negative genitourinary   Musculoskeletal negative musculoskeletal ROS (+)   Abdominal   Peds  Hematology negative hematology ROS (+)   Anesthesia Other Findings IOL for cHTN  Reproductive/Obstetrics (+) Pregnancy                             Anesthesia Physical Anesthesia Plan  ASA: III  Anesthesia Plan: Epidural   Post-op Pain Management:    Induction:   PONV Risk Score and Plan: Treatment may vary due to age or medical condition  Airway Management Planned: Natural Airway  Additional Equipment:   Intra-op Plan:   Post-operative Plan:   Informed Consent: I have reviewed the patients History and Physical, chart, labs and discussed the procedure including the risks, benefits and alternatives for the proposed anesthesia with the patient or authorized representative who has indicated his/her understanding and acceptance.       Plan Discussed with: Anesthesiologist  Anesthesia Plan Comments: (Patient identified. Risks, benefits, options discussed with patient including but not limited to bleeding, infection, nerve damage, paralysis, failed block, incomplete pain control, headache, blood pressure changes, nausea,  vomiting, reactions to medication, itching, and post partum back pain. Confirmed with bedside nurse the patient's most recent platelet count. Confirmed with the patient that they are not taking any anticoagulation, have any bleeding history or any family history of bleeding disorders. Patient expressed understanding and wishes to proceed. All questions were answered. )        Anesthesia Quick Evaluation

## 2020-05-15 NOTE — Plan of Care (Signed)
  Problem: Education: Goal: Knowledge of Childbirth will improve Outcome: Progressing Goal: Ability to make informed decisions regarding treatment and plan of care will improve Outcome: Progressing Goal: Ability to state and carry out methods to decrease the pain will improve Outcome: Progressing   Problem: Coping: Goal: Ability to verbalize concerns and feelings about labor and delivery will improve Outcome: Progressing   Problem: Life Cycle: Goal: Ability to make normal progression through stages of labor will improve Outcome: Progressing   

## 2020-05-16 LAB — CBC
HCT: 32.8 % — ABNORMAL LOW (ref 36.0–46.0)
Hemoglobin: 11.4 g/dL — ABNORMAL LOW (ref 12.0–15.0)
MCH: 29.2 pg (ref 26.0–34.0)
MCHC: 34.8 g/dL (ref 30.0–36.0)
MCV: 84.1 fL (ref 80.0–100.0)
Platelets: 267 10*3/uL (ref 150–400)
RBC: 3.9 MIL/uL (ref 3.87–5.11)
RDW: 13.9 % (ref 11.5–15.5)
WBC: 13.4 10*3/uL — ABNORMAL HIGH (ref 4.0–10.5)
nRBC: 0 % (ref 0.0–0.2)

## 2020-05-16 NOTE — Anesthesia Postprocedure Evaluation (Signed)
Anesthesia Post Note  Patient: Therapist, occupational  Procedure(s) Performed: AN AD HOC LABOR EPIDURAL     Patient location during evaluation: Mother Baby Anesthesia Type: Epidural Level of consciousness: awake and alert Pain management: pain level controlled Vital Signs Assessment: post-procedure vital signs reviewed and stable Respiratory status: spontaneous breathing, nonlabored ventilation and respiratory function stable Cardiovascular status: stable Postop Assessment: no headache, no backache, epidural receding, patient able to bend at knees, no apparent nausea or vomiting, able to ambulate and adequate PO intake Anesthetic complications: no   No complications documented.  Last Vitals:  Vitals:   05/15/20 2300 05/16/20 0415  BP: 124/88 (!) 114/56  Pulse: 71 72  Resp: 17 18  Temp: 36.8 C 36.8 C  SpO2: 100% 100%    Last Pain:  Vitals:   05/16/20 0512  TempSrc:   PainSc: 0-No pain   Pain Goal: Patients Stated Pain Goal: 9 (05/15/20 1815)                 Laban Emperor

## 2020-05-16 NOTE — Progress Notes (Signed)
Patient is eating, ambulating, voiding.  Pain control is good.  Vitals:   05/15/20 2100 05/15/20 2140 05/15/20 2300 05/16/20 0415  BP: 135/75 134/78 124/88 (!) 114/56  Pulse: 71 65 71 72  Resp: 16 18 17 18   Temp: (!) 97.5 F (36.4 C) 98.3 F (36.8 C) 98.2 F (36.8 C) 98.3 F (36.8 C)  TempSrc: Oral Oral Oral Oral  SpO2:  100% 100% 100%  Weight:      Height:        Fundus firm Perineum without swelling.  Lab Results  Component Value Date   WBC 13.4 (H) 05/16/2020   HGB 11.4 (L) 05/16/2020   HCT 32.8 (L) 05/16/2020   MCV 84.1 05/16/2020   PLT 267 05/16/2020    --/--/A POS (03/14 0035)/RI  A/P Post partum day 1.  Routine care.  Expect d/c tomorrow.    02-22-1979

## 2020-05-17 ENCOUNTER — Ambulatory Visit: Payer: Self-pay

## 2020-05-17 MED ORDER — IBUPROFEN 600 MG PO TABS: 600.0000 mg | ORAL_TABLET | Freq: Four times a day (QID) | ORAL | 0 refills | Status: DC

## 2020-05-17 MED ORDER — ACETAMINOPHEN 325 MG PO TABS: 650.0000 mg | ORAL_TABLET | ORAL | 0 refills | Status: DC | PRN

## 2020-05-17 MED ORDER — SENNOSIDES-DOCUSATE SODIUM 8.6-50 MG PO TABS: 2.0000 | ORAL_TABLET | Freq: Every day | ORAL | 0 refills | Status: DC

## 2020-05-17 NOTE — Discharge Summary (Signed)
Postpartum Discharge Summary     Patient Name: Brooke Robinson DOB: [birth year] MRN: [id number]  Date of admission: 05/15/2020 Delivery date:05/15/2020  Delivering provider: Vanessa Kick  Date of discharge: 05/17/2020  Admitting diagnosis: Benign essential hypertension antepartum in third trimester [O10.013] Spontaneous vaginal delivery [O80] Intrauterine pregnancy: [redacted]w[redacted]d     Secondary diagnosis:  Active Problems:   Benign essential hypertension antepartum in third trimester   Spontaneous vaginal delivery  Additional problems: none   Discharge diagnosis: Term Pregnancy Delivered                                              Post partum procedures:none Augmentation: AROM, Pitocin and Cytotec Complications: None  Hospital course: Induction of Labor With Vaginal Delivery   20 y.o. yo [id number] at [redacted]w[redacted]d was admitted to the hospital 05/15/2020 for induction of labor.  Indication for induction: CHTN.  Patient had an uncomplicated labor course as follows: Membrane Rupture Time/Date: 12:53 PM ,05/15/2020   Delivery Method:Vaginal, Spontaneous  Episiotomy: None  Lacerations:  None  Details of delivery can be found in separate delivery note.  Patient had a routine postpartum course. For chronic hypertension, on no medications and was normotensive postpartum. Patient is discharged home 05/17/20.  Newborn Data: Birth date:05/15/2020  Birth time:7:44 PM  Gender:Female  Living status:Living  Apgars:9 ,9  Weight:2665 g   Magnesium Sulfate received: No BMZ received: No Rhophylac:N/A MMR:No T-DaP:Given prenatally Flu: Yes, Given prenatally Transfusion:No  Physical exam  Vitals:   05/16/20 1205 05/16/20 1349 05/16/20 2116 05/17/20 0512  BP: 132/68 119/68 120/72 136/85  Pulse: 70 72 69 66  Resp: $Remo'16 17 16 18  'fnCMw$ Temp: 98.2 F (36.8 C) 98.5 F (36.9 C) 98.4 F (36.9 C) 98.2 F (36.8 C)  TempSrc: Oral Oral Oral   SpO2: 100% 100% 100% 100%  Weight:      Height:       General:  alert Lochia: appropriate Uterine Fundus: firm DVT Evaluation: No evidence of DVT seen on physical exam. Labs: Lab Results  Component Value Date   WBC 13.4 (H) 05/16/2020   HGB 11.4 (L) 05/16/2020   HCT 32.8 (L) 05/16/2020   MCV 84.1 05/16/2020   PLT 267 05/16/2020   CMP Latest Ref Rng & Units 05/15/2020  Glucose 70 - 99 mg/dL 82  BUN 6 - 20 mg/dL 5(L)  Creatinine 0.44 - 1.00 mg/dL 0.66  Sodium 135 - 145 mmol/L 135  Potassium 3.5 - 5.1 mmol/L 3.7  Chloride 98 - 111 mmol/L 106  CO2 22 - 32 mmol/L 21(L)  Calcium 8.9 - 10.3 mg/dL 9.2  Total Protein 6.5 - 8.1 g/dL 6.0(L)  Total Bilirubin 0.3 - 1.2 mg/dL 0.5  Alkaline Phos 38 - 126 U/L 88  AST 15 - 41 U/L 15  ALT 0 - 44 U/L 11   Edinburgh Score: Edinburgh Postnatal Depression Scale Screening Tool 05/16/2020  I have been able to laugh and see the funny side of things. 0  I have looked forward with enjoyment to things. 1  I have blamed myself unnecessarily when things went wrong. 1  I have been anxious or worried for no good reason. 0  I have felt scared or panicky for no good reason. 0  Things have been getting on top of me. 0  I have been so unhappy that I have had difficulty sleeping. 0  I have felt sad or miserable. 0  I have been so unhappy that I have been crying. 0  The thought of harming myself has occurred to me. 0  Edinburgh Postnatal Depression Scale Total 2      After visit meds:  Allergies as of 05/17/2020   No Known Allergies     Medication List    STOP taking these medications   valACYclovir 1000 MG tablet Commonly known as: VALTREX     TAKE these medications   acetaminophen 325 MG tablet Commonly known as: Tylenol Take 2 tablets (650 mg total) by mouth every 4 (four) hours as needed (for pain scale < 4).   ibuprofen 600 MG tablet Commonly known as: ADVIL Take 1 tablet (600 mg total) by mouth every 6 (six) hours.   prenatal multivitamin Tabs tablet Take 1 tablet by mouth daily at 12 noon.    senna-docusate 8.6-50 MG tablet Commonly known as: Senokot-S Take 2 tablets by mouth daily.        Discharge home in stable condition Infant Feeding: Breast Infant Disposition:home with mother Discharge instruction: per After Visit Summary and Postpartum booklet. Activity: Advance as tolerated. Pelvic rest for 6 weeks.  Diet: routine diet Anticipated Birth Control: Unsure Postpartum Appointment:4 weeks Additional Postpartum F/U: BP check 4 weeks. Discussed home BP monitoring.  Future Appointments:No future appointments. Follow up Visit:  Follow-up Information    Vanessa Kick, MD. Schedule an appointment as soon as possible for a visit in 4 week(s).   Specialty: Obstetrics and Gynecology Contact information: [address] [place] [address] [phone]                   05/17/2020 Jonelle Sidle, MD

## 2020-05-17 NOTE — Lactation Note (Signed)
This note was copied from a baby's chart. Lactation Consultation Note  Patient Name: Brooke Robinson Today's Date: 05/17/2020   Age:21 hours  Infant is 38+6 weeks and is 5-14 at 9 % wt loss this am . Mother is exclusively  Breastfeeding. She has not supplemented at all. Mother is using a pacifier.  Mother reports that infant has been breastfeeding well. She reports that she just finished a 35 min feeding.  LC ask mother if she was able to hear her swallow. She reports that she had not observed her swallowing.  After discussion about latching LC ask mother is she would like some assistance with breastfeeding and positioning infant .  Assist mother with latching infant . Infant has a shallow latch. She needed lots of assistance with flanging her lips for wider gape. Adjusted lower jaw with chin tug. Infant breastfed with few swallows on both breast for 15 and 10 mins. Infant was still hungry. I hand mother to hand express and 4 ml ebm was given to infant with a curved tip syringe and finger. Mother was given a harmony hand puimp . She began pump for 15 mins on each side.  Suggested that mother think about DBM or formula as a supplement . Mother was given a supplemental guide with amts . wncouraged to page the nurse for supplement if she was unable to get enough volume for the infant. Encouraged limited use of the pacifier.   Observed that infant has a anterior lingual frenulum. Recommend that this infant have a Specialist evaluate for a revision.   Plan of Care : Breastfeed infant with feeding cues Supplement infant with ebm/formula, according to supplemental guidelines. Pump using a DEBP after each feeding for 15-20 mins.   Mother to continue to cue base feed infant and feed at least 8-12 times or more in 24 hours and advised to allow for cluster feeding infant as needed.  Mother to continue to due STS. Mother is aware of available LC services at [facility], BFSG'S, OP Dept, and phone # for questions  or concerns about breastfeeding.  Mother receptive to all teaching and plan of care.  Mother to see Jimmye Norman RN,IBCLC at [facility] office  Maternal Data    Feeding    LATCH Score                    Lactation Tools Discussed/Used    Interventions    Discharge    Consult Status      Michel Bickers 05/17/2020, 3:16 PM

## 2020-05-17 NOTE — Progress Notes (Addendum)
Post Partum Day 2 Subjective: no complaints, up ad lib, voiding and tolerating PO  Objective: Patient Vitals for the past 24 hrs:  BP Temp Temp src Pulse Resp SpO2  05/17/20 0512 136/85 98.2 F (36.8 C) -- 66 18 100 %  05/16/20 2116 120/72 98.4 F (36.9 C) Oral 69 16 100 %  05/16/20 1349 119/68 98.5 F (36.9 C) Oral 72 17 100 %  05/16/20 1205 132/68 98.2 F (36.8 C) Oral 70 16 100 %    Physical Exam:  General: alert Lochia: appropriate Uterine Fundus: firm DVT Evaluation: No evidence of DVT seen on physical exam.  Recent Labs    05/15/20 0035 05/15/20 2148 05/16/20 0520  WBC 9.5 14.1* 13.4*  HGB 11.6* 12.2 11.4*  HCT 32.7* 35.7* 32.8*  PLT 256 257 267    Recent Labs    05/15/20 0035  NA 135  K 3.7  CL 106  BUN 5*  CREATININE 0.66  GLUCOSE 82  BILITOT 0.5  ALT 11  AST 15  ALKPHOS 88  PROT 6.0*  ALBUMIN 2.8*    Recent Labs    05/15/20 0035  CALCIUM 9.2   Assessment/Plan: Discharge home  Brooke Robinson 20 y.o. [id number] PPD#2 s/p SVD at [redacted]w[redacted]d  1. PPC: no lacs, EBL 102, hgb 12.2>11.4 2. CHTN: on no meds, normotensive pp. Recommend Home BP monitoring. 3. SCT 4. HSV 5. Vaccines s/p tdap, flu in pregnancy. Counseled on COVID vaccine, declines 6. Rubella immune, blood type A+, breastfeeding, baby girl in room   LOS: 2 days   Brooke Robinson 05/17/2020, 10:50 AM

## 2020-05-17 NOTE — Discharge Instructions (Signed)
Postpartum Care After Vaginal Delivery The following information offers guidance about how to care for yourself from the time you deliver your baby to 6-12 weeks after delivery (postpartum period). If you have problems or questions, contact your health care provider for more specific instructions. Follow these instructions at home: Vaginal bleeding  It is normal to have vaginal bleeding (lochia) after delivery. Wear a sanitary pad for bleeding and discharge. ? During the first week after delivery, the amount and appearance of lochia is often similar to a menstrual period. ? Over the next few weeks, it will gradually decrease to a dry, yellow-brown discharge. ? For most women, lochia stops completely by 4-6 weeks after delivery, but can vary.  Change your sanitary pads frequently. Watch for any changes in your flow, such as: ? A sudden increase in volume. ? A change in color. ? Large blood clots.  If you pass a blood clot from your vagina, save it and call your health care provider. Do not flush blood clots down the toilet before talking with your health care provider.  Do not use tampons or douches until your health care provider approves.  If you are not breastfeeding, your period should return 6-8 weeks after delivery. If you are feeding your baby breast milk only, your period may not return until you stop breastfeeding. Perineal care  Keep the area between the vagina and the anus (perineum) clean and dry. Use medicated pads and pain-relieving sprays and creams as directed.  If you had a surgical cut in the perineum (episiotomy) or a tear, check the area for signs of infection until you are healed. Check for: ? More redness, swelling, or pain. ? Fluid or blood coming from the cut or tear. ? Warmth. ? Pus or a bad smell.  You may be given a squirt bottle to use instead of wiping to clean the perineum area after you use the bathroom. Pat the area gently to dry it.  To relieve pain  caused by an episiotomy, a tear, or swollen veins in the anus (hemorrhoids), take a warm sitz bath 2-3 times a day. In a sitz bath, the warm water should only come up to your hips and cover your buttocks.    Breast care  In the first few days after delivery, your breasts may feel heavy, full, and uncomfortable (breast engorgement). Milk may also leak from your breasts. Ask your health care provider about ways to help relieve the discomfort.  If you are breastfeeding: ? Wear a bra that supports your breasts and fits well. Use breast pads to absorb milk that leaks. ? Keep your nipples clean and dry. Apply creams and ointments as told. ? You may have uterine contractions every time you breastfeed for up to several weeks after delivery. This helps your uterus return to its normal size. ? If you have any problems with breastfeeding, notify your health care provider or lactation consultant.  If you are not breastfeeding: ? Avoid touching your breasts. Do not squeeze out (express) milk. Doing this can make your breasts produce more milk. ? Wear a good-fitting bra and use cold packs to help with swelling. Intimacy and sexuality  Ask your health care provider when you can engage in sexual activity. This may depend upon: ? Your risk of infection. ? How fast you are healing. ? Your comfort and desire to engage in sexual activity.  You are able to get pregnant after delivery, even if you have not had your period. Talk   with your health care provider about methods of birth control (contraception) or family planning if you desire future pregnancies. Medicines  Take over-the-counter and prescription medicines only as told by your health care provider.  Take an over-the-counter stool softener to help ease bowel movements as told by your health care provider.  If you were prescribed an antibiotic medicine, take it as told by your health care provider. Do not stop taking the antibiotic even if you start  to feel better.  Review all previous and current prescriptions to check for possible transfer into breast milk. Activity  Gradually return to your normal activities as told by your health care provider.  Rest as much as possible. Nap while your baby is sleeping. Eating and drinking  Drink enough fluid to keep your urine pale yellow.  To help prevent or relieve constipation, eat high-fiber foods every day.  Choose healthy eating to support breastfeeding or weight loss goals.  Take your prenatal vitamins until your health care provider tells you to stop.    General tips/recommendations  Do not use any products that contain nicotine or tobacco. These products include cigarettes, chewing tobacco, and vaping devices, such as e-cigarettes. If you need help quitting, ask your health care provider.  Do not drink alcohol, especially if you are breastfeeding.  Do not take medications or drugs that are not prescribed to you, especially if you are breastfeeding.  Visit your health care provider for a postpartum checkup within the first 3-6 weeks after delivery.  Complete a comprehensive postpartum visit no later than 12 weeks after delivery.  Keep all follow-up visits for you and your baby. Contact a health care provider if:  You feel unusually sad or worried.  Your breasts become red, painful, or hard.  You have a fever or other signs of an infection.  You have bleeding that is soaking through one pad an hour or you have blood clots.  You have a severe headache that doesn't go away or you have vision changes.  You have nausea and vomiting and are unable to eat or drink anything for 24 hours. Get help right away if:  You have chest pain or difficulty breathing.  You have sudden, severe leg pain.  You faint or have a seizure.  You have thoughts about hurting yourself or your baby. If you ever feel like you may hurt yourself or others, or have thoughts about taking your own  life, get help right away. Go to your nearest emergency department or:  Call your local emergency services (911 in the [place]  The National Suicide Prevention Lifeline at 1-800-273-8255. This suicide crisis helpline is open 24 hours a day.  Text the Crisis Text Line at 741741 (in the [place] Summary  The period of time after you deliver your newborn up to 6-12 weeks after delivery is called the postpartum period.  Keep all follow-up visits for you and your baby.  Review all previous and current prescriptions to check for possible transfer into breast milk.  Contact a health care provider if you feel unusually sad or worried during the postpartum period. This information is not intended to replace advice given to you by your health care provider. Make sure you discuss any questions you have with your health care provider. Document Revised: 11/04/2019 Document Reviewed: 11/04/2019 Elsevier Patient Education  2021 Elsevier Inc.  Hypertension During Pregnancy Hypertension is also called high blood pressure. High blood pressure means that the force of the blood moving in your body   is high enough to cause problems for you and your baby. Different types of high blood pressure can happen during pregnancy. The types are:  High blood pressure before you got pregnant. This is called chronic hypertension.  This can continue during your pregnancy. Your doctor will want to keep checking your blood pressure. You may need medicine to control your blood pressure while you are pregnant. You will need follow-up visits after you have your baby.  High blood pressure that goes up during pregnancy when it was normal before. This is called gestational hypertension. It will often get better after you have your baby, but your doctor will need to watch your blood pressure to make sure that it is getting better.  You may develop high blood pressure after giving birth. This is called postpartum hypertension. This  often occurs within 48 hours after childbirth but may occur up to 6 weeks after giving birth. Very high blood pressure during pregnancy is an emergency that needs treatment right away. How does this affect me? If you have high blood pressure during pregnancy, you have a higher chance of developing high blood pressure:  As you get older.  If you get pregnant again. In some cases, high blood pressure during pregnancy can cause:  Stroke.  Heart attack.  Damage to the kidneys, lungs, or liver.  Preeclampsia.  HELLP syndrome.  Seizures.  Problems with the placenta. How does this affect my baby? Your baby may:  Be born early.  Not weigh as much as he or she should.  Not handle labor well, leading to a C-section. This condition may also result in a baby's death before birth (stillbirth). What are the risks?  Having high blood pressure during a past pregnancy.  Being overweight.  Being age 35 or older.  Being pregnant for the first time.  Being pregnant with more than one baby.  Becoming pregnant using fertility methods, such as IVF.  Having other problems, such as diabetes or kidney disease. What can I do to lower my risk?  Keep a healthy weight.  Eat a healthy diet.  Follow what your doctor tells you about treating any medical problems that you had before you got pregnant. It is very important to go to all of your doctor visits. Your doctor will check your blood pressure and make sure that your pregnancy is progressing as it should. Treatment should start early if a problem is found.   How is this treated? Treatment for high blood pressure during pregnancy can vary. It depends on the type of high blood pressure you have and how serious it is.  If you were taking medicine for your blood pressure before you got pregnant, talk with your doctor. You may need to change the medicine during pregnancy if it is not safe for your baby.  If your blood pressure goes up  during pregnancy, your doctor may order medicine to treat this.  If you are at risk for preeclampsia, your doctor may tell you to take a low-dose aspirin while you are pregnant.  If you have very high blood pressure, you may need to stay in the hospital so you and your baby can be watched closely. You may also need to take medicine to lower your blood pressure.  In some cases, if your condition gets worse, you may need to have your baby early. Follow these instructions at home: Eating and drinking  Drink enough fluid to keep your pee (urine) pale yellow.  Avoid caffeine.     Lifestyle  Do not smoke or use any products that contain nicotine or tobacco. If you need help quitting, ask your doctor.  Do not use alcohol or drugs.  Avoid stress.  Rest and get plenty of sleep.  Regular exercise can help. Ask your doctor what kinds of exercise are best for you. General instructions  Take over-the-counter and prescription medicines only as told by your doctor.  Keep all prenatal and follow-up visits. Contact a doctor if:  You have symptoms that your doctor told you to watch for, such as: ? Headaches. ? A feeling like you may vomit (nausea). ? Vomiting. ? Belly (abdominal) pain. ? Feeling dizzy or light-headed. Get help right away if:  You have symptoms of serious problems, such as: ? Very bad belly pain that does not get better with treatment. ? A very bad headache that does not get better. ? Blurry vision. ? Double vision. ? Vomiting that does not get better. ? Sudden, fast weight gain. ? Sudden swelling in your hands, ankles, or face. ? Bleeding from your vagina. ? Blood in your pee. ? Shortness of breath. ? Chest pain. ? Weakness on one side of your body. ? Trouble talking.  Your baby is not moving as much as usual. These symptoms may be an emergency. Get help right away. Call your local emergency services (911 in the [place]  Do not wait to see if the symptoms will go  away.  Do not drive yourself to the hospital. Summary  High blood pressure is also called hypertension.  High blood pressure means that the force of the blood moving in your body is high enough to cause problems for you and your baby.  Get help right away if you have symptoms of serious problems due to high blood pressure.  Keep all prenatal and follow-up visits. This information is not intended to replace advice given to you by your health care provider. Make sure you discuss any questions you have with your health care provider. Document Revised: 11/11/2019 Document Reviewed: 11/11/2019 Elsevier Patient Education  2021 [employer] Inc.   

## 2020-09-06 DIAGNOSIS — Z3202 Encounter for pregnancy test, result negative: Secondary | ICD-10-CM | POA: Diagnosis not present

## 2020-09-06 DIAGNOSIS — Z3042 Encounter for surveillance of injectable contraceptive: Secondary | ICD-10-CM | POA: Diagnosis not present

## 2020-09-28 ENCOUNTER — Ambulatory Visit (HOSPITAL_COMMUNITY)
Admission: EM | Admit: 2020-09-28 | Discharge: 2020-09-28 | Disposition: A | Discharge status: Home / Self Care | Payer: BC Managed Care – PPO | Attending: Internal Medicine | Admitting: Internal Medicine

## 2020-09-28 ENCOUNTER — Other Ambulatory Visit: Payer: Self-pay

## 2020-09-28 ENCOUNTER — Encounter (HOSPITAL_COMMUNITY): Payer: Self-pay | Admitting: Emergency Medicine

## 2020-09-28 DIAGNOSIS — J302 Other seasonal allergic rhinitis: Secondary | ICD-10-CM | POA: Diagnosis not present

## 2020-09-28 DIAGNOSIS — H6502 Acute serous otitis media, left ear: Secondary | ICD-10-CM

## 2020-09-28 MED ORDER — FLUTICASONE PROPIONATE 50 MCG/ACT NA SUSP: 1.0000 | Freq: Every day | NASAL | 0 refills | Status: DC

## 2020-09-28 MED ORDER — CETIRIZINE HCL 10 MG PO TABS: 10.0000 mg | ORAL_TABLET | Freq: Every day | ORAL | 0 refills | Status: DC

## 2020-09-28 NOTE — ED Provider Notes (Signed)
[facility]    CSN: [id number] Arrival date & time: 09/28/20  1819      History   Chief Complaint Chief Complaint  Patient presents with   Otalgia    Left      HPI Brooke Robinson is a 20 y.o. female.   Patient presents with 1 day history of left-sided ear pain that is intermittent.  Denies any upper respiratory symptoms or runny nose.  Denies any known fevers at home.  Denies any sick contacts.   Otalgia  Past Medical History:  Diagnosis Date   Herpes    Hypertension    Seasonal allergies     Patient Active Problem List   Diagnosis Date Noted   Benign essential hypertension antepartum in third trimester 05/15/2020   Spontaneous vaginal delivery 05/15/2020   Pregnant 08/27/2018    Past Surgical History:  Procedure Laterality Date   TONSILLECTOMY      OB History     Gravida  2   Para  2   Term  2   Preterm      AB      Living  2      SAB      IAB      Ectopic      Multiple  0   Live Births  2            Home Medications    Prior to Admission medications   Medication Sig Start Date End Date Taking? Authorizing Provider  cetirizine (ZYRTEC) 10 MG tablet Take 1 tablet (10 mg total) by mouth daily. 09/28/20  Yes Lance Muss, FNP  fluticasone (FLONASE) 50 MCG/ACT nasal spray Place 1 spray into both nostrils daily. 09/28/20  Yes Lance Muss, FNP  acetaminophen (TYLENOL) 325 MG tablet Take 2 tablets (650 mg total) by mouth every 4 (four) hours as needed (for pain scale < 4). 05/17/20   Taam-Akelman, Griselda Miner, MD  ibuprofen (ADVIL) 600 MG tablet Take 1 tablet (600 mg total) by mouth every 6 (six) hours. 05/17/20   Taam-Akelman, Griselda Miner, MD  Prenatal Vit-Fe Fumarate-FA (PRENATAL MULTIVITAMIN) TABS tablet Take 1 tablet by mouth daily at 12 noon.    [provider]  senna-docusate (SENOKOT-S) 8.6-50 MG tablet Take 2 tablets by mouth daily. 05/17/20   Rande Brunt, MD    Family History Family History   Problem Relation Age of Onset   Healthy Mother     Social History Social History   Tobacco Use   Smoking status: Never   Smokeless tobacco: Never  Vaping Use   Vaping Use: Never used  Substance Use Topics   Alcohol use: Never   Drug use: Never     Allergies   Patient has no known allergies.   Review of Systems Review of Systems Per HPI  Physical Exam Triage Vital Signs ED Triage Vitals  Enc Vitals Group     BP 09/28/20 1921 124/68     Pulse Rate 09/28/20 1921 80     Resp 09/28/20 1921 17     Temp 09/28/20 1921 98.2 F (36.8 C)     Temp Source 09/28/20 1921 Oral     SpO2 09/28/20 1921 97 %     Weight --      Height --      Head Circumference --      Peak Flow --      Pain Score 09/28/20 1920 8     Pain Loc --  Pain Edu? --      Excl. in GC? --    No data found.  Updated Vital Signs BP 124/68   Pulse 80   Temp 98.2 F (36.8 C) (Oral)   Resp 17   SpO2 97%   Breastfeeding Yes   Visual Acuity Right Eye Distance:   Left Eye Distance:   Bilateral Distance:    Right Eye Near:   Left Eye Near:    Bilateral Near:     Physical Exam Constitutional:      General: She is not in acute distress.    Appearance: Normal appearance.  HENT:     Head: Normocephalic and atraumatic.     Right Ear: Ear canal normal. No middle ear effusion. Tympanic membrane is not erythematous.     Left Ear: Ear canal normal. A middle ear effusion is present. Tympanic membrane is not erythematous.     Nose: Congestion present.     Mouth/Throat:     Mouth: Mucous membranes are moist.     Pharynx: No posterior oropharyngeal erythema.  Eyes:     Extraocular Movements: Extraocular movements intact.     Conjunctiva/sclera: Conjunctivae normal.     Pupils: Pupils are equal, round, and reactive to light.  Cardiovascular:     Rate and Rhythm: Normal rate and regular rhythm.     Pulses: Normal pulses.     Heart sounds: Normal heart sounds.  Pulmonary:     Effort: Pulmonary  effort is normal. No respiratory distress.     Breath sounds: Normal breath sounds. No wheezing.  Abdominal:     General: Abdomen is flat. Bowel sounds are normal.     Palpations: Abdomen is soft.  Musculoskeletal:        General: Normal range of motion.     Cervical back: Normal range of motion.  Skin:    General: Skin is warm and dry.  Neurological:     General: No focal deficit present.     Mental Status: She is alert and oriented to person, place, and time. Mental status is at baseline.  Psychiatric:        Mood and Affect: Mood normal.        Behavior: Behavior normal.     UC Treatments / Results  Labs (all labs ordered are listed, but only abnormal results are displayed) Labs Reviewed - No data to display  EKG   Radiology No results found.  Procedures Procedures (including critical care time)  Medications Ordered in UC Medications - No data to display  Initial Impression / Assessment and Plan / UC Course  I have reviewed the triage vital signs and the nursing notes.  Pertinent labs & imaging results that were available during my care of the patient were reviewed by me and considered in my medical decision making (see chart for details).     Patient has left serous otitis media along with nasal congestion that is consistent with allergic rhinitis.  Patient to stop Benadryl and start cetirizine and Flonase daily due to their safety with lactation.Discussed strict return precautions. Patient verbalized understanding and is agreeable with plan.  Final Clinical Impressions(s) / UC Diagnoses   Final diagnoses:  Non-recurrent acute serous otitis media of left ear  Seasonal allergic rhinitis, unspecified trigger     Discharge Instructions      Please discontinue diphenhydramine that you are currently taking for allergies.  Please start cetirizine and Flonase daily.  Please follow-up with primary care physician or urgent  care if pain does not improve or does not  resolve.     ED Prescriptions     Medication Sig Dispense Auth. Provider   cetirizine (ZYRTEC) 10 MG tablet Take 1 tablet (10 mg total) by mouth daily. 30 tablet Lance Muss, FNP   fluticasone (FLONASE) 50 MCG/ACT nasal spray Place 1 spray into both nostrils daily. 16 g Lance Muss, FNP      PDMP not reviewed this encounter.   Lance Muss, FNP 09/28/20 1958

## 2020-09-28 NOTE — ED Triage Notes (Signed)
Pt is present today with left ear pain. Pt states that she noticed the pain yesterday.

## 2020-09-28 NOTE — Discharge Instructions (Addendum)
Please discontinue diphenhydramine that you are currently taking for allergies.  Please start cetirizine and Flonase daily.  Please follow-up with primary care physician or urgent care if pain does not improve or does not resolve.

## 2020-10-08 ENCOUNTER — Ambulatory Visit (HOSPITAL_COMMUNITY)
Admission: EM | Admit: 2020-10-08 | Discharge: 2020-10-08 | Disposition: A | Discharge status: Home / Self Care | Payer: BC Managed Care – PPO

## 2020-10-08 ENCOUNTER — Encounter (HOSPITAL_COMMUNITY): Payer: Self-pay

## 2020-10-08 DIAGNOSIS — M67432 Ganglion, left wrist: Secondary | ICD-10-CM | POA: Diagnosis not present

## 2020-10-08 NOTE — ED Provider Notes (Signed)
[facility]    CSN: [id number] Arrival date & time: 10/08/20  1159      History   Chief Complaint Chief Complaint  Patient presents with   Wrist Pain    HPI Brooke Robinson is a 20 y.o. female.   Pt complains of a knot on her wrist   The history is provided by the patient. No language interpreter was used.  Wrist Pain This is a new problem. The current episode started 3 to 5 hours ago. The problem occurs constantly. The problem has not changed since onset.Nothing aggravates the symptoms. Nothing relieves the symptoms. She has tried nothing for the symptoms. The treatment provided no relief.   Past Medical History:  Diagnosis Date   Herpes    Hypertension    Seasonal allergies     Patient Active Problem List   Diagnosis Date Noted   Benign essential hypertension antepartum in third trimester 05/15/2020   Spontaneous vaginal delivery 05/15/2020   Pregnant 08/27/2018    Past Surgical History:  Procedure Laterality Date   TONSILLECTOMY      OB History     Gravida  2   Para  2   Term  2   Preterm      AB      Living  2      SAB      IAB      Ectopic      Multiple  0   Live Births  2            Home Medications    Prior to Admission medications   Medication Sig Start Date End Date Taking? Authorizing Provider  cetirizine (ZYRTEC) 10 MG tablet Take 1 tablet (10 mg total) by mouth daily. 09/28/20  Yes Lance Muss, FNP  fluticasone (FLONASE) 50 MCG/ACT nasal spray Place 1 spray into both nostrils daily. 09/28/20  Yes Lance Muss, FNP  acetaminophen (TYLENOL) 325 MG tablet Take 2 tablets (650 mg total) by mouth every 4 (four) hours as needed (for pain scale < 4). 05/17/20   Taam-Akelman, Griselda Miner, MD  ibuprofen (ADVIL) 600 MG tablet Take 1 tablet (600 mg total) by mouth every 6 (six) hours. 05/17/20   Taam-Akelman, Griselda Miner, MD  Prenatal Vit-Fe Fumarate-FA (PRENATAL MULTIVITAMIN) TABS tablet Take 1 tablet by mouth daily at 12  noon.    [provider]  senna-docusate (SENOKOT-S) 8.6-50 MG tablet Take 2 tablets by mouth daily. 05/17/20   Rande Brunt, MD    Family History Family History  Problem Relation Age of Onset   Healthy Mother     Social History Social History   Tobacco Use   Smoking status: Never   Smokeless tobacco: Never  Vaping Use   Vaping Use: Never used  Substance Use Topics   Alcohol use: Never   Drug use: Never     Allergies   Patient has no known allergies.   Review of Systems Review of Systems  All other systems reviewed and are negative.   Physical Exam Triage Vital Signs ED Triage Vitals  Enc Vitals Group     BP 10/08/20 1307 137/77     Pulse Rate 10/08/20 1307 72     Resp 10/08/20 1307 18     Temp 10/08/20 1307 98.4 F (36.9 C)     Temp Source 10/08/20 1307 Oral     SpO2 10/08/20 1307 99 %     Weight --      Height --  Head Circumference --      Peak Flow --      Pain Score 10/08/20 1305 7     Pain Loc --      Pain Edu? --      Excl. in GC? --    No data found.  Updated Vital Signs BP 137/77 (BP Location: Right Arm)   Pulse 72   Temp 98.4 F (36.9 C) (Oral)   Resp 18   SpO2 99%   Visual Acuity Right Eye Distance:   Left Eye Distance:   Bilateral Distance:    Right Eye Near:   Left Eye Near:    Bilateral Near:     Physical Exam Vitals reviewed.  Musculoskeletal:        General: Swelling and tenderness present.     Comments: 2cm swollen area dorsal wrist left. From  nv and ns intact   Skin:    General: Skin is warm.  Neurological:     General: No focal deficit present.     Mental Status: She is alert.  Psychiatric:        Mood and Affect: Mood normal.     UC Treatments / Results  Labs (all labs ordered are listed, but only abnormal results are displayed) Labs Reviewed - No data to display  EKG   Radiology No results found.  Procedures Procedures (including critical care time)  Medications Ordered in  UC Medications - No data to display  Initial Impression / Assessment and Plan / UC Course  I have reviewed the triage vital signs and the nursing notes.  Pertinent labs & imaging results that were available during my care of the patient were reviewed by me and considered in my medical decision making (see chart for details).     MDM: Pt counseled on ganglion cyst  Final Clinical Impressions(s) / UC Diagnoses   Final diagnoses:  Ganglion cyst of dorsum of left wrist   Discharge Instructions   None    ED Prescriptions   None    PDMP not reviewed this encounter. An After Visit Summary was printed and given to the patient.    Elson Areas, [place] 10/08/20 4076

## 2020-10-08 NOTE — ED Triage Notes (Signed)
Pt states pain to left wrist, states she does not know how it happened. Pt is able to flex wrist up and down, is able to feel touch and denying numbness/ tingling.

## 2020-12-13 IMAGING — CT CT RENAL STONE PROTOCOL
2 of 4 series · 15 of 46 positions shown, 17 images · non-contrast
Comparison: None.

CLINICAL DATA: Flank pain, stone disease suspected

EXAM:
CT ABDOMEN AND PELVIS WITHOUT CONTRAST
TECHNIQUE: Multidetector CT imaging of the abdomen and pelvis was performed
following the standard protocol without IV contrast.

[Series 3: stone study 5.0 i30f 2 · axial · 0.68mm/px · z∈[+725,+1175]mm · 12 of 100 slices shown, 14 images]
[im 5/100  soft-tissue]
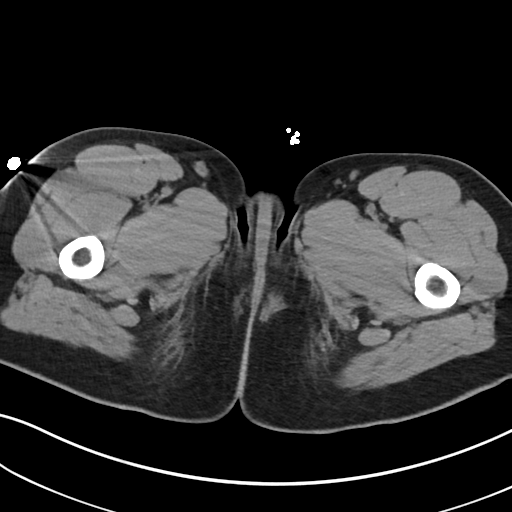
[im 5/100  bone]
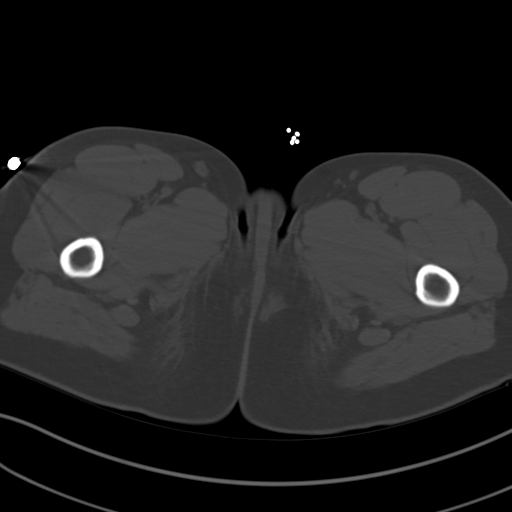
[im 13/100  soft-tissue]
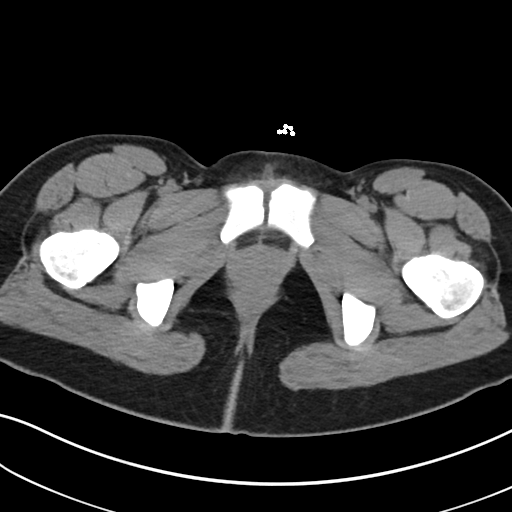
[im 21/100  soft-tissue]
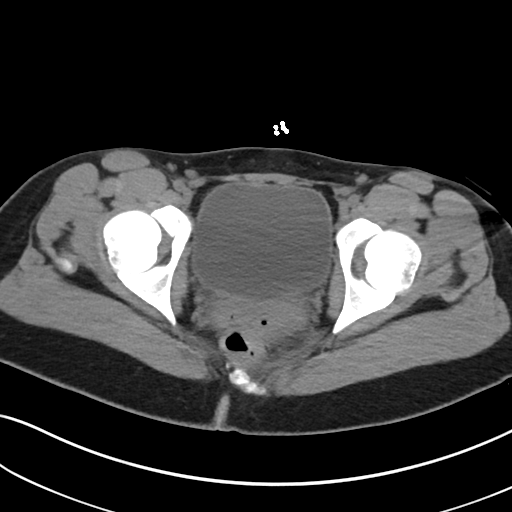
[im 29/100  soft-tissue]
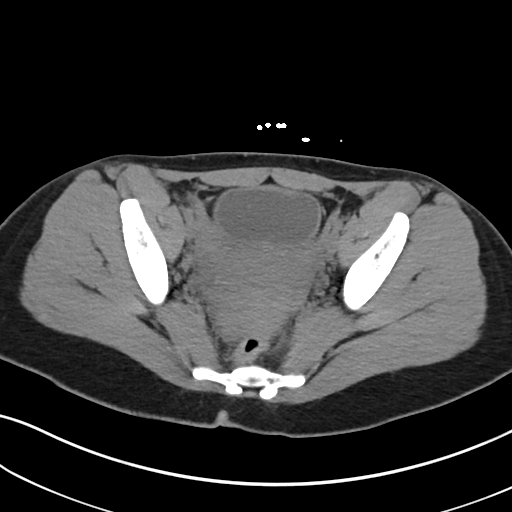
[im 38/100  soft-tissue]
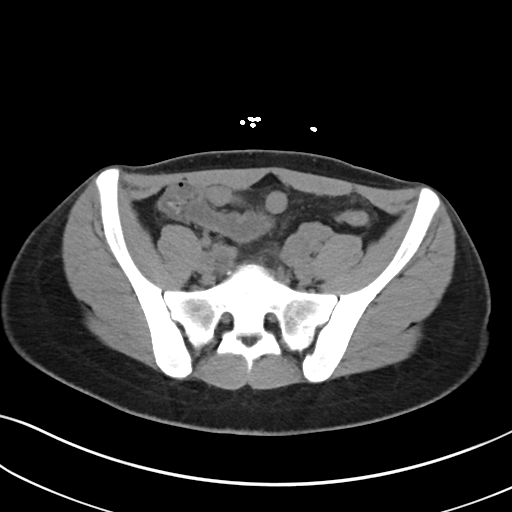
[im 46/100  soft-tissue]
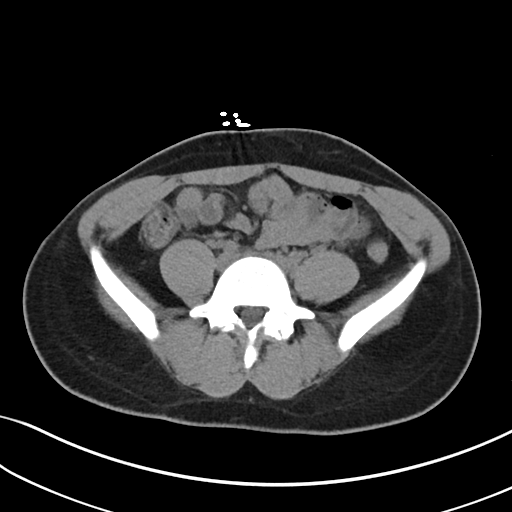
[im 54/100  soft-tissue]
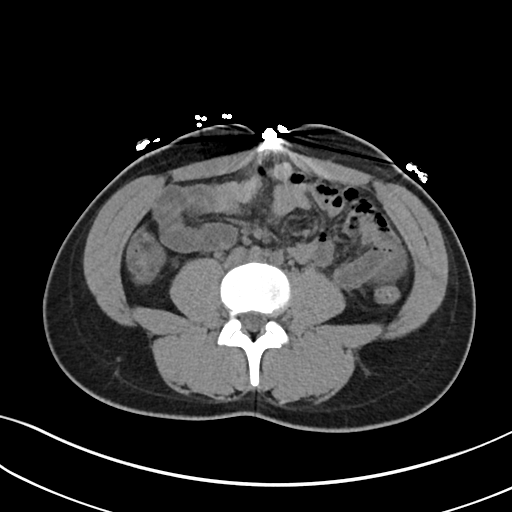
[im 62/100  soft-tissue]
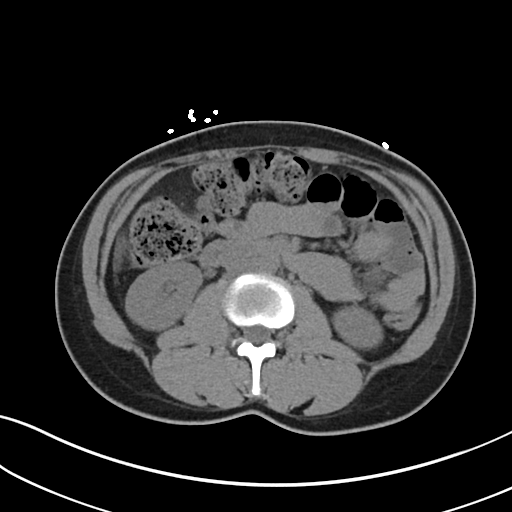
[im 71/100  soft-tissue]
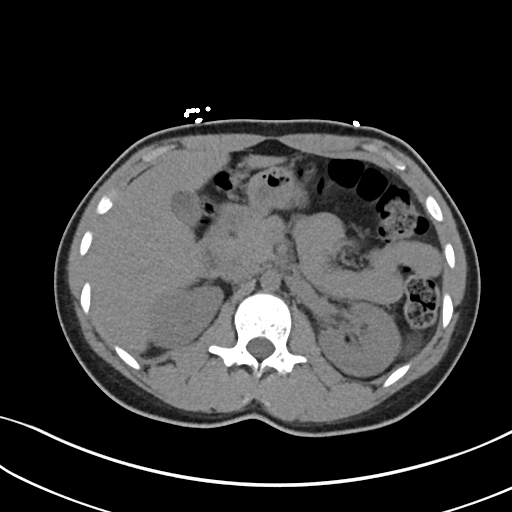
[im 71/100  bone]
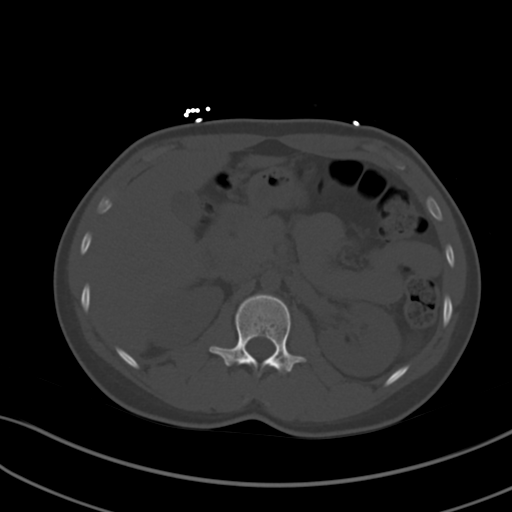
[im 79/100  soft-tissue]
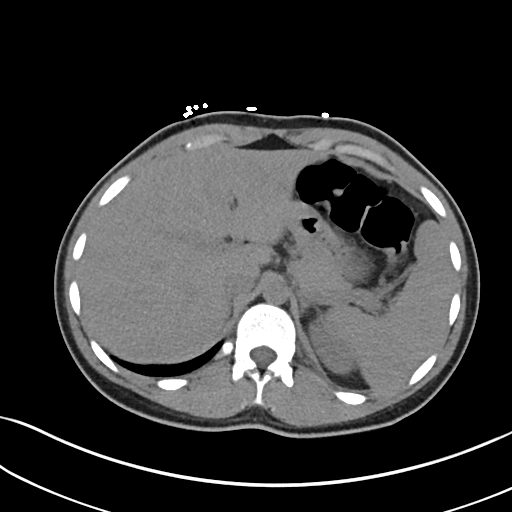
[im 87/100  soft-tissue]
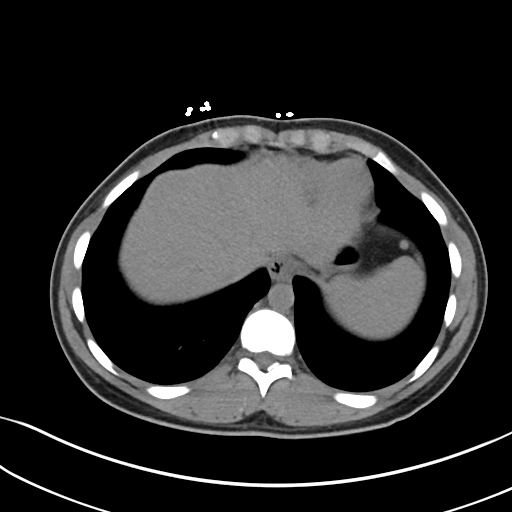
[im 95/100  soft-tissue]
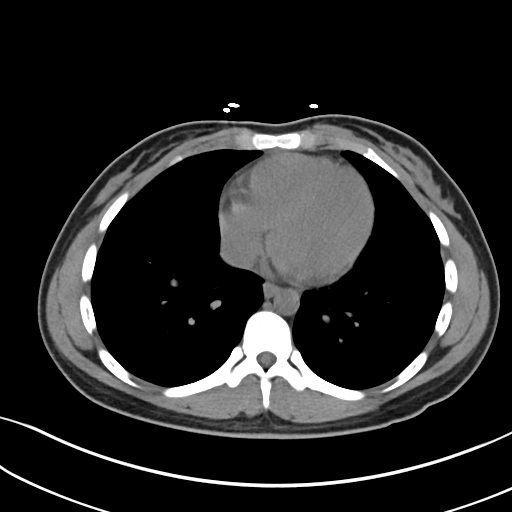

[Series 6: coronal soft tissue · coronal · 0.69mm/px · 3 of 80 slices shown]
[im 27/80  soft-tissue]
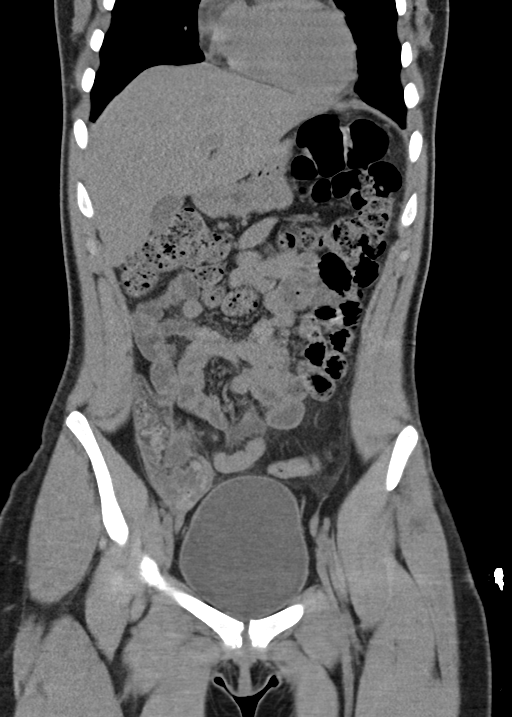
[im 36/80  soft-tissue]
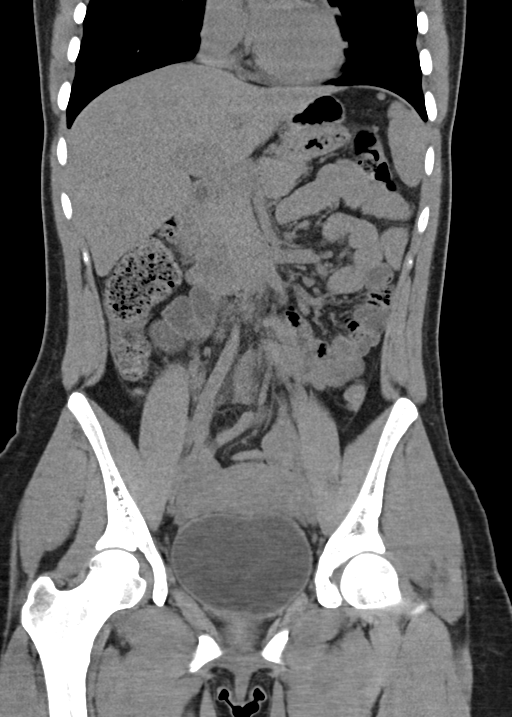
[im 44/80  soft-tissue]
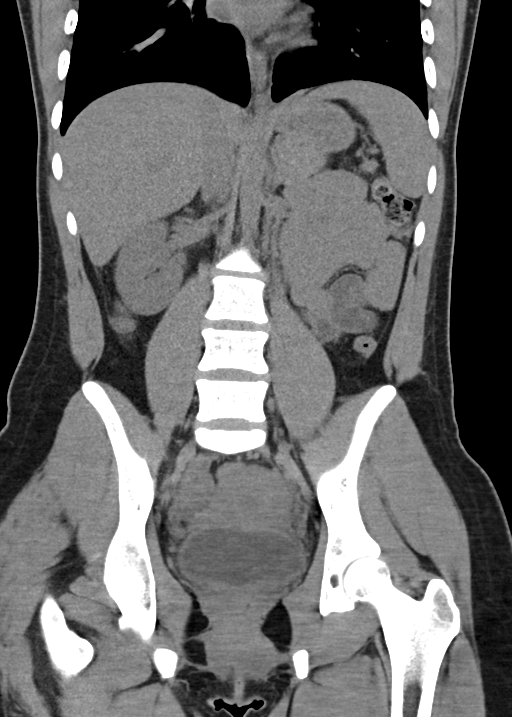

[15 of 46 positions shown; findings below may reference images not displayed]

FINDINGS: Lower chest: Lung bases are clear. Normal heart size. No pericardial
effusion.

Hepatobiliary: Normal hepatic attenuation. Smooth liver surface
contour. No visible liver lesions on this unenhanced CT. Normal
gallbladder and biliary tree. No visible calcified gallstones.

Pancreas: Unremarkable. No pancreatic ductal dilatation or
surrounding inflammatory changes.

Spleen: Normal in size without focal abnormality.

Adrenals/Urinary Tract: Adrenal glands are unremarkable. Kidneys are
normal, without renal calculi, focal lesion, or hydronephrosis.
Bladder is unremarkable.

Stomach/Bowel: Distal esophagus, stomach and duodenal sweep are
unremarkable. Multiple fluid-filled loops of small bowel are seen in
pelvis without frank dilatation or evidence of high-grade
obstruction. Moderate colonic stool burden. Mild edematous
thickening of the cecum superimposed on some more chronic intramural
could reflect acute inflammation on sequela of more remote post
inflammatory change.

Vascular/Lymphatic: No significant vascular findings. Some reactive
mesenteric adenopathy is suspected. No pathologically enlarged nodes
in the abdomen or pelvis.

Reproductive: Anteverted uterus. Normal follicles in the ovaries. No
concerning adnexal lesions.

Other: No abdominopelvic free air. Small amount fluid seen in the
posterior cul-de-sac. Tiny calcification in the posterior cul-de-sac
as well, possible peritoneal loose body.

Musculoskeletal: No acute osseous abnormality or suspicious osseous
lesion.
IMPRESSION: 1. Multiple fluid-filled loops of small bowel in the pelvis with
mild edematous thickening of the cecum could be seen in the setting
of a mild enterocolitis.
2. No urolithiasis, hydronephrosis or other acute urinary tract
abnormality.
3. Small amount of fluid in the posterior cul-de-sac, can
physiologic in a reproductive age female.
4. Intramural fat at the level of the cecum could reflect sequela
prior inflammation.

## 2020-12-24 ENCOUNTER — Emergency Department (HOSPITAL_COMMUNITY)
Admission: EM | Admit: 2020-12-24 | Discharge: 2020-12-24 | Disposition: A | Discharge status: Home / Self Care | Payer: BC Managed Care – PPO | Attending: Emergency Medicine | Admitting: Emergency Medicine

## 2020-12-24 ENCOUNTER — Other Ambulatory Visit: Payer: Self-pay

## 2020-12-24 DIAGNOSIS — Z20822 Contact with and (suspected) exposure to covid-19: Secondary | ICD-10-CM | POA: Insufficient documentation

## 2020-12-24 DIAGNOSIS — J029 Acute pharyngitis, unspecified: Secondary | ICD-10-CM | POA: Insufficient documentation

## 2020-12-24 DIAGNOSIS — I10 Essential (primary) hypertension: Secondary | ICD-10-CM | POA: Insufficient documentation

## 2020-12-24 LAB — RESP PANEL BY RT-PCR (FLU A&B, COVID) ARPGX2
Influenza A by PCR: NEGATIVE
Influenza B by PCR: NEGATIVE
SARS Coronavirus 2 by RT PCR: NEGATIVE

## 2020-12-24 LAB — GROUP A STREP BY PCR: Group A Strep by PCR: NOT DETECTED

## 2020-12-24 MED ORDER — LIDOCAINE VISCOUS HCL 2 % MT SOLN
15.0000 mL | OROMUCOSAL | 0 refills | Status: DC | PRN
Start: 2020-12-24 — End: 2022-10-15

## 2020-12-24 NOTE — ED Triage Notes (Signed)
Pt c/o sore throat x 1 week

## 2020-12-24 NOTE — ED Provider Notes (Signed)
[facility] EMERGENCY DEPARTMENT Provider Note   CSN: [id number] Arrival date & time: 12/24/20  0049     History Chief Complaint  Patient presents with   Sore Throat    Brooke Robinson is a 20 y.o. female.  The history is provided by the patient and medical records.  Sore Throat  19 y.o. F with hx of genital herpes, HTN, seasonal allergies, presenting to the ED for sore throat.  Patient states her 2 children are both sick with rhinovirus, have tested negative for covid/flu.  States throat is sore, pain with swallowing but no difficulty swallowing.  States she feels like she is getting sick on her stomach from not being able to eat much.  No fevers.    Past Medical History:  Diagnosis Date   Herpes    Hypertension    Seasonal allergies     Patient Active Problem List   Diagnosis Date Noted   Benign essential hypertension antepartum in third trimester 05/15/2020   Spontaneous vaginal delivery 05/15/2020   Pregnant 08/27/2018    Past Surgical History:  Procedure Laterality Date   TONSILLECTOMY       OB History     Gravida  2   Para  2   Term  2   Preterm      AB      Living  2      SAB      IAB      Ectopic      Multiple  0   Live Births  2           Family History  Problem Relation Age of Onset   Healthy Mother     Social History   Tobacco Use   Smoking status: Never   Smokeless tobacco: Never  Vaping Use   Vaping Use: Never used  Substance Use Topics   Alcohol use: Never   Drug use: Never    Home Medications Prior to Admission medications   Medication Sig Start Date End Date Taking? Authorizing Provider  lidocaine (XYLOCAINE) 2 % solution Use as directed 15 mLs in the mouth or throat as needed for mouth pain. 12/24/20  Yes Garlon Hatchet, PA-C  acetaminophen (TYLENOL) 325 MG tablet Take 2 tablets (650 mg total) by mouth every 4 (four) hours as needed (for pain scale < 4). 05/17/20   Taam-Akelman, Griselda Miner,  MD  cetirizine (ZYRTEC) 10 MG tablet Take 1 tablet (10 mg total) by mouth daily. 09/28/20   Gustavus Bryant, FNP  fluticasone (FLONASE) 50 MCG/ACT nasal spray Place 1 spray into both nostrils daily. 09/28/20   Gustavus Bryant, FNP  ibuprofen (ADVIL) 600 MG tablet Take 1 tablet (600 mg total) by mouth every 6 (six) hours. 05/17/20   Taam-Akelman, Griselda Miner, MD  Prenatal Vit-Fe Fumarate-FA (PRENATAL MULTIVITAMIN) TABS tablet Take 1 tablet by mouth daily at 12 noon.    [provider]  senna-docusate (SENOKOT-S) 8.6-50 MG tablet Take 2 tablets by mouth daily. 05/17/20   Rande Brunt, MD    Allergies    Patient has no known allergies.  Review of Systems   Review of Systems  HENT:  Positive for sore throat.   All other systems reviewed and are negative.  Physical Exam Updated Vital Signs BP (!) 149/88 (BP Location: Right Arm)   Pulse 80   Temp 98.4 F (36.9 C) (Oral)   Resp 15   Ht 5\' 5"  (1.651 m)   Wt  80.7 kg   SpO2 99%   BMI 29.62 kg/m   Physical Exam Vitals and nursing note reviewed.  Constitutional:      Appearance: She is well-developed.  HENT:     Head: Normocephalic and atraumatic.     Right Ear: Tympanic membrane and ear canal normal.     Left Ear: Tympanic membrane and ear canal normal.     Nose: Nose normal.     Mouth/Throat:     Mouth: Mucous membranes are moist.     Pharynx: Oropharynx is clear.     Comments: Tonsils absent; some PND noted, uvula midline without evidence of peritonsillar abscess; handling secretions appropriately; no difficulty swallowing or speaking; normal phonation without stridor Eyes:     Conjunctiva/sclera: Conjunctivae normal.     Pupils: Pupils are equal, round, and reactive to light.  Cardiovascular:     Rate and Rhythm: Normal rate and regular rhythm.     Heart sounds: Normal heart sounds.  Pulmonary:     Effort: Pulmonary effort is normal.     Breath sounds: Normal breath sounds.  Abdominal:     General: Bowel sounds  are normal.     Palpations: Abdomen is soft.  Musculoskeletal:        General: Normal range of motion.     Cervical back: Normal range of motion.  Skin:    General: Skin is warm and dry.  Neurological:     Mental Status: She is alert and oriented to person, place, and time.    ED Results / Procedures / Treatments   Labs (all labs ordered are listed, but only abnormal results are displayed) Labs Reviewed  GROUP A STREP BY PCR  RESP PANEL BY RT-PCR (FLU A&B, COVID) [id number]    EKG None  Radiology No results found.  Procedures Procedures   Medications Ordered in ED Medications - No data to display  ED Course  I have reviewed the triage vital signs and the nursing notes.  Pertinent labs & imaging results that were available during my care of the patient were reviewed by me and considered in my medical decision making (see chart for details).    MDM Rules/Calculators/A&P                           20 y.o. F here with sore throat for the past week.  Both children sick with rhinovirus currently.  She is afebrile, non-toxic. Tonsils surgically absent, some PND present but no edema or exudates, handling secretions well, normal phonation without stridor.  Strep test negative.  As both children sick with rhinovirus, suspect this is likely viral process.  Covid/flu screen sent as a precaution, will be notified if positive results.  D/c home with viscous lidocaine to help with discomfort.  Can follow-up with PCP.  Return here for new concerns.  Final Clinical Impression(s) / ED Diagnoses Final diagnoses:  Sore throat    Rx / DC Orders ED Discharge Orders          Ordered    lidocaine (XYLOCAINE) 2 % solution  As needed        12/24/20 0618             Garlon Hatchet, PA-C 12/24/20 0631    Zadie Rhine, MD 12/24/20 [phone]

## 2020-12-24 NOTE — Discharge Instructions (Signed)
You will be notified if covid/flu test is positive. Take the prescribed medication as directed.  Make sure to stay hydrated. Follow-up with your primary care doctor. Return to the ED for new or worsening symptoms.

## 2022-04-16 ENCOUNTER — Encounter (HOSPITAL_COMMUNITY): Payer: Self-pay

## 2022-04-16 ENCOUNTER — Ambulatory Visit (INDEPENDENT_AMBULATORY_CARE_PROVIDER_SITE_OTHER): Payer: BC Managed Care – PPO

## 2022-04-16 ENCOUNTER — Ambulatory Visit (HOSPITAL_COMMUNITY)
Admission: EM | Admit: 2022-04-16 | Discharge: 2022-04-16 | Disposition: A | Discharge status: Home / Self Care | Payer: BC Managed Care – PPO

## 2022-04-16 DIAGNOSIS — M79672 Pain in left foot: Secondary | ICD-10-CM

## 2022-04-16 NOTE — ED Triage Notes (Signed)
Pt reports having a pop in her left foot yesterday while playing and is now having difficulty walking.

## 2022-04-16 NOTE — ED Provider Notes (Signed)
[place]    CSN: [id number] Arrival date & time: 04/16/22  [id number]      History   Chief Complaint Chief Complaint  Patient presents with   Foot Pain    HPI Brooke Robinson is a 22 y.o. female.   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   The history is provided by the patient. No language interpreter was used.    Past Medical History:  Diagnosis Date   Herpes    Hypertension    Seasonal allergies     Patient Active Problem List   Diagnosis Date Noted   Foot pain, left 04/16/2022   Benign essential hypertension antepartum in third trimester 05/15/2020   Spontaneous vaginal delivery 05/15/2020   Pregnant 08/27/2018    Past Surgical History:  Procedure Laterality Date   TONSILLECTOMY      OB History     Gravida  2   Para  2   Term  2   Preterm      AB      Living  2      SAB      IAB      Ectopic      Multiple  0   Live Births  2            Home Medications    Prior to Admission medications   Medication Sig Start Date End Date Taking? Authorizing Provider  valACYclovir (VALTREX) 500 MG tablet Take 500 mg by mouth 2 (two) times daily.   Yes [provider]  acetaminophen (TYLENOL) 325 MG tablet Take 2 tablets (650 mg total) by mouth every 4 (four) hours as needed (for pain scale < 4). 05/17/20   Taam-Akelman, Lawrence Santiago, MD  cetirizine (ZYRTEC) 10 MG tablet Take 1 tablet (10 mg total) by mouth daily. 09/28/20   Teodora Medici, FNP  fluticasone (FLONASE) 50 MCG/ACT nasal spray Place 1 spray into both nostrils daily. 09/28/20   Teodora Medici, FNP  ibuprofen (ADVIL) 600 MG tablet Take 1 tablet (600 mg total) by mouth every 6 (six) hours. 05/17/20   Taam-Akelman, Lawrence Santiago, MD  lidocaine (XYLOCAINE) 2 % solution Use as directed 15 mLs in the mouth or throat as needed for mouth  pain. 12/24/20   Larene Pickett, PA-C  Prenatal Vit-Fe Fumarate-FA (PRENATAL MULTIVITAMIN) TABS tablet Take 1 tablet by mouth daily at 12 noon.    [provider]  senna-docusate (SENOKOT-S) 8.6-50 MG tablet Take 2 tablets by mouth daily. 05/17/20   Jonelle Sidle, MD    Family History Family History  Problem Relation Age of Onset   Healthy Mother     Social History Social History   Tobacco Use   Smoking status: Never   Smokeless tobacco: Never  Vaping Use   Vaping Use: Never used  Substance Use Topics   Alcohol use: Never   Drug use: Never     Allergies   Patient has no known allergies.   Review of Systems Review of Systems  Musculoskeletal:  Positive for arthralgias, gait problem and myalgias.  Skin:  Negative for color change.  All other systems reviewed and are negative.    Physical Exam Triage Vital Signs ED Triage Vitals  Enc Vitals Group     BP 04/16/22 1002 133/82     Pulse  Rate 04/16/22 1002 70     Resp 04/16/22 1002 17     Temp 04/16/22 1002 98.2 F (36.8 C)     Temp src --      SpO2 04/16/22 1002 97 %     Weight --      Height --      Head Circumference --      Peak Flow --      Pain Score 04/16/22 1000 10     Pain Loc --      Pain Edu? --      Excl. in [place]? --    No data found.  Updated Vital Signs BP 133/82   Pulse 70   Temp 98.2 F (36.8 C)   Resp 17   LMP 03/16/2022   SpO2 97%   Breastfeeding No   Visual Acuity Right Eye Distance:   Left Eye Distance:   Bilateral Distance:    Right Eye Near:   Left Eye Near:    Bilateral Near:     Physical Exam Vitals and nursing note reviewed.  Constitutional:      General: She is not in acute distress.    Appearance: She is well-developed.  HENT:     Head: Normocephalic and atraumatic.  Eyes:     Conjunctiva/sclera: Conjunctivae normal.  Cardiovascular:     Rate and Rhythm: Normal rate and regular rhythm.     Pulses: Normal pulses.          Dorsalis pedis  pulses are 2+ on the left side.     Heart sounds: Normal heart sounds. No murmur heard. Pulmonary:     Effort: Pulmonary effort is normal. No respiratory distress.     Breath sounds: Normal breath sounds and air entry.  Abdominal:     Palpations: Abdomen is soft.     Tenderness: There is no abdominal tenderness.  Musculoskeletal:        General: No swelling.     Cervical back: Neck supple.       Feet:  Skin:    General: Skin is warm and dry.     Capillary Refill: Capillary refill takes less than 2 seconds.  Neurological:     General: No focal deficit present.     Mental Status: She is alert and oriented to person, place, and time.     GCS: GCS eye subscore is 4. GCS verbal subscore is 5. GCS motor subscore is 6.     Cranial Nerves: Cranial nerves 2-12 are intact.     Sensory: Sensation is intact.     Motor: Motor function is intact.     Coordination: Coordination is intact.     Gait: Gait is intact.  Psychiatric:        Attention and Perception: Attention normal.        Mood and Affect: Mood normal.        Speech: Speech normal.        Behavior: Behavior normal. Behavior is cooperative.      UC Treatments / Results  Labs (all labs ordered are listed, but only abnormal results are displayed) Labs Reviewed - No data to display  EKG   Radiology DG Foot Complete Left  Result Date: 04/16/2022 CLINICAL DATA:  [name] pop while walking yesterday.  Pain. EXAM: LEFT FOOT - COMPLETE 3+ VIEW COMPARISON:  None Available. FINDINGS: Mild hallux valgus deformity. No acute fracture or dislocation. No significant soft tissue swelling. IMPRESSION: No acute osseous abnormality. Electronically Signed  By: Abigail Miyamoto M.D.   On: 04/16/2022 10:14    Procedures Procedures (including critical care time)  Medications Ordered in UC Medications - No data to display  Initial Impression / Assessment and Plan / UC Course  I have reviewed the triage vital signs and the nursing  notes.  Pertinent labs & imaging results that were available during my care of the patient were reviewed by me and considered in my medical decision making (see chart for details).     Ddx: Left foot pain, contusion, sprain,strain Final Clinical Impressions(s) / UC Diagnoses   Final diagnoses:  Foot pain, left     Discharge Instructions      Wear ace wrap for comfort. Your xray was normal, no acute findings. May take tylenol/ibuprofen as label directed for pain.      ED Prescriptions   None    PDMP not reviewed this encounter.   Tori Milks, NP [id number]

## 2022-04-16 NOTE — Discharge Instructions (Signed)
Wear ace wrap for comfort. Your xray was normal, no acute findings. May take tylenol/ibuprofen as label directed for pain.

## 2022-05-26 ENCOUNTER — Ambulatory Visit (HOSPITAL_COMMUNITY)
Admission: EM | Admit: 2022-05-26 | Discharge: 2022-05-26 | Disposition: A | Discharge status: Home / Self Care | Payer: BC Managed Care – PPO | Attending: Physician Assistant | Admitting: Physician Assistant

## 2022-05-26 ENCOUNTER — Encounter (HOSPITAL_COMMUNITY): Payer: Self-pay

## 2022-05-26 DIAGNOSIS — M79601 Pain in right arm: Secondary | ICD-10-CM

## 2022-05-26 DIAGNOSIS — T22211A Burn of second degree of right forearm, initial encounter: Secondary | ICD-10-CM | POA: Diagnosis not present

## 2022-05-26 DIAGNOSIS — T24212A Burn of second degree of left thigh, initial encounter: Secondary | ICD-10-CM | POA: Diagnosis not present

## 2022-05-26 DIAGNOSIS — T2122XA Burn of second degree of abdominal wall, initial encounter: Secondary | ICD-10-CM

## 2022-05-26 DIAGNOSIS — T300 Burn of unspecified body region, unspecified degree: Secondary | ICD-10-CM

## 2022-05-26 DIAGNOSIS — R1032 Left lower quadrant pain: Secondary | ICD-10-CM

## 2022-05-26 MED ORDER — SILVER SULFADIAZINE 1 % EX CREA: 1.0000 | TOPICAL_CREAM | Freq: Two times a day (BID) | CUTANEOUS | 0 refills | Status: DC
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
## 2022-06-21 ENCOUNTER — Telehealth: Payer: BC Managed Care – PPO

## 2022-08-06 ENCOUNTER — Encounter (HOSPITAL_COMMUNITY): Payer: Self-pay

## 2022-08-06 ENCOUNTER — Emergency Department (HOSPITAL_COMMUNITY): Payer: BC Managed Care – PPO

## 2022-08-06 ENCOUNTER — Emergency Department (HOSPITAL_COMMUNITY)
Admission: EM | Admit: 2022-08-06 | Discharge: 2022-08-06 | Disposition: A | Discharge status: Home / Self Care | Payer: BC Managed Care – PPO | Attending: Emergency Medicine | Admitting: Emergency Medicine

## 2022-08-06 ENCOUNTER — Other Ambulatory Visit: Payer: Self-pay

## 2022-08-06 DIAGNOSIS — S46912A Strain of unspecified muscle, fascia and tendon at shoulder and upper arm level, left arm, initial encounter: Secondary | ICD-10-CM | POA: Diagnosis not present

## 2022-08-06 DIAGNOSIS — M25512 Pain in left shoulder: Secondary | ICD-10-CM | POA: Diagnosis present

## 2022-08-06 DIAGNOSIS — S161XXA Strain of muscle, fascia and tendon at neck level, initial encounter: Secondary | ICD-10-CM | POA: Diagnosis not present

## 2022-08-06 DIAGNOSIS — Y92410 Unspecified street and highway as the place of occurrence of the external cause: Secondary | ICD-10-CM | POA: Diagnosis not present

## 2022-08-06 DIAGNOSIS — S0990XA Unspecified injury of head, initial encounter: Secondary | ICD-10-CM | POA: Diagnosis not present

## 2022-08-06 DIAGNOSIS — R112 Nausea with vomiting, unspecified: Secondary | ICD-10-CM

## 2022-08-06 DIAGNOSIS — S39012A Strain of muscle, fascia and tendon of lower back, initial encounter: Secondary | ICD-10-CM | POA: Diagnosis not present

## 2022-08-06 LAB — URINALYSIS, ROUTINE W REFLEX MICROSCOPIC
Bilirubin Urine: NEGATIVE
Glucose, UA: NEGATIVE mg/dL
Hgb urine dipstick: NEGATIVE
Ketones, ur: NEGATIVE mg/dL
Leukocytes,Ua: NEGATIVE
Nitrite: NEGATIVE
Protein, ur: NEGATIVE mg/dL
Specific Gravity, Urine: 1.009 (ref 1.005–1.030)
pH: 6 (ref 5.0–8.0)

## 2022-08-06 LAB — CBC WITH DIFFERENTIAL/PLATELET
Abs Immature Granulocytes: 0.02 10*3/uL (ref 0.00–0.07)
Basophils Absolute: 0 10*3/uL (ref 0.0–0.1)
Basophils Relative: 0 %
Eosinophils Absolute: 0.3 10*3/uL (ref 0.0–0.5)
Eosinophils Relative: 4 %
HCT: 35.7 % — ABNORMAL LOW (ref 36.0–46.0)
Hemoglobin: 12 g/dL (ref 12.0–15.0)
Immature Granulocytes: 0 %
Lymphocytes Relative: 39 %
Lymphs Abs: 2.6 10*3/uL (ref 0.7–4.0)
MCH: 28.4 pg (ref 26.0–34.0)
MCHC: 33.6 g/dL (ref 30.0–36.0)
MCV: 84.4 fL (ref 80.0–100.0)
Monocytes Absolute: 0.4 10*3/uL (ref 0.1–1.0)
Monocytes Relative: 6 %
Neutro Abs: 3.3 10*3/uL (ref 1.7–7.7)
Neutrophils Relative %: 51 %
Platelets: 340 10*3/uL (ref 150–400)
RBC: 4.23 MIL/uL (ref 3.87–5.11)
RDW: 13.5 % (ref 11.5–15.5)
WBC: 6.6 10*3/uL (ref 4.0–10.5)
nRBC: 0 % (ref 0.0–0.2)

## 2022-08-06 LAB — COMPREHENSIVE METABOLIC PANEL
ALT: 17 U/L (ref 0–44)
AST: 17 U/L (ref 15–41)
Albumin: 4.1 g/dL (ref 3.5–5.0)
Alkaline Phosphatase: 58 U/L (ref 38–126)
Anion gap: 8 (ref 5–15)
BUN: 11 mg/dL (ref 6–20)
CO2: 22 mmol/L (ref 22–32)
Calcium: 9.2 mg/dL (ref 8.9–10.3)
Chloride: 106 mmol/L (ref 98–111)
Creatinine, Ser: 0.78 mg/dL (ref 0.44–1.00)
GFR, Estimated: >60 mL/min (ref >60)
Glucose, Bld: 87 mg/dL (ref 70–99)
Potassium: 3.6 mmol/L (ref 3.5–5.1)
Sodium: 136 mmol/L (ref 135–145)
Total Bilirubin: 0.6 mg/dL (ref 0.3–1.2)
Total Protein: 7.7 g/dL (ref 6.5–8.1)

## 2022-08-06 LAB — HCG, SERUM, QUALITATIVE: Preg, Serum: NEGATIVE

## 2022-08-06 MED ORDER — ONDANSETRON HCL 4 MG PO TABS: 4.0000 mg | ORAL_TABLET | Freq: Three times a day (TID) | ORAL | 0 refills | Status: AC | PRN

## 2022-08-06 MED ORDER — ONDANSETRON HCL 4 MG/2ML IJ SOLN: 4.0000 mg | Freq: Once | INTRAMUSCULAR | Status: DC

## 2022-08-06 MED ORDER — KETOROLAC TROMETHAMINE 15 MG/ML IJ SOLN
15.0000 mg | Freq: Once | INTRAMUSCULAR | Status: AC
  Administered 2022-08-06: 15 mg via INTRAVENOUS
  Filled 2022-08-06: qty 1

## 2022-08-06 MED ORDER — IBUPROFEN 600 MG PO TABS: 600.0000 mg | ORAL_TABLET | Freq: Three times a day (TID) | ORAL | 0 refills | Status: AC | PRN

## 2022-08-06 MED ORDER — CYCLOBENZAPRINE HCL 10 MG PO TABS: 10.0000 mg | ORAL_TABLET | Freq: Three times a day (TID) | ORAL | 0 refills | Status: AC | PRN

## 2022-08-06 MED ORDER — LIDOCAINE 5 % EX PTCH: 2.0000 | MEDICATED_PATCH | CUTANEOUS | 0 refills | Status: AC

## 2022-08-06 MED ORDER — KETOROLAC TROMETHAMINE 15 MG/ML IJ SOLN: 15.0000 mg | Freq: Once | INTRAMUSCULAR | Status: DC

## 2022-08-06 MED ORDER — ONDANSETRON 4 MG PO TBDP: 4.0000 mg | ORAL_TABLET | Freq: Once | ORAL | Status: DC
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
## 2022-08-13 ENCOUNTER — Ambulatory Visit
Admission: EM | Admit: 2022-08-13 | Discharge: 2022-08-13 | Disposition: A | Discharge status: Home / Self Care | Payer: BC Managed Care – PPO | Attending: Nurse Practitioner | Admitting: Nurse Practitioner

## 2022-08-13 DIAGNOSIS — S46912A Strain of unspecified muscle, fascia and tendon at shoulder and upper arm level, left arm, initial encounter: Secondary | ICD-10-CM

## 2022-08-13 DIAGNOSIS — S46012A Strain of muscle(s) and tendon(s) of the rotator cuff of left shoulder, initial encounter: Secondary | ICD-10-CM

## 2022-08-13 MED ORDER — PREDNISONE 10 MG (21) PO TBPK
ORAL_TABLET | Freq: Every day | ORAL | 0 refills | Status: DC
Start: 2022-08-13 — End: 2022-10-15
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
## 2022-08-13 NOTE — Discharge Instructions (Signed)
Start prednisone as prescribed You may continue the muscle relaxer and ibuprofen as prescribed in the emergency room Heat to the shoulder as needed Rest Follow-up with your PCP at your scheduled appointment tomorrow Please go to the emergency room for any worsening symptoms
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
## 2022-10-14 ENCOUNTER — Other Ambulatory Visit: Payer: Self-pay

## 2022-10-14 ENCOUNTER — Ambulatory Visit (HOSPITAL_COMMUNITY)
Admission: EM | Admit: 2022-10-14 | Discharge: 2022-10-15 | Disposition: A | Discharge status: Other Acute Inpatient Hospital | Payer: BC Managed Care – PPO | Attending: Registered Nurse | Admitting: Registered Nurse

## 2022-10-14 ENCOUNTER — Encounter (HOSPITAL_COMMUNITY): Payer: Self-pay | Admitting: Registered Nurse

## 2022-10-14 DIAGNOSIS — Z9151 Personal history of suicidal behavior: Secondary | ICD-10-CM | POA: Insufficient documentation

## 2022-10-14 DIAGNOSIS — F1290 Cannabis use, unspecified, uncomplicated: Secondary | ICD-10-CM | POA: Diagnosis not present

## 2022-10-14 DIAGNOSIS — R45851 Suicidal ideations: Secondary | ICD-10-CM

## 2022-10-14 DIAGNOSIS — Z9152 Personal history of nonsuicidal self-harm: Secondary | ICD-10-CM | POA: Insufficient documentation

## 2022-10-14 DIAGNOSIS — F333 Major depressive disorder, recurrent, severe with psychotic symptoms: Secondary | ICD-10-CM | POA: Diagnosis not present

## 2022-10-14 DIAGNOSIS — N390 Urinary tract infection, site not specified: Secondary | ICD-10-CM | POA: Diagnosis present

## 2022-10-14 LAB — COMPREHENSIVE METABOLIC PANEL
ALT: 16 U/L (ref 0–44)
AST: 18 U/L (ref 15–41)
Albumin: 3.9 g/dL (ref 3.5–5.0)
Alkaline Phosphatase: 56 U/L (ref 38–126)
Anion gap: 11 (ref 5–15)
BUN: 10 mg/dL (ref 6–20)
CO2: 20 mmol/L — ABNORMAL LOW (ref 22–32)
Calcium: 9.2 mg/dL (ref 8.9–10.3)
Chloride: 107 mmol/L (ref 98–111)
Creatinine, Ser: 0.91 mg/dL (ref 0.44–1.00)
GFR, Estimated: >60 mL/min (ref >60)
Glucose, Bld: 116 mg/dL — ABNORMAL HIGH (ref 70–99)
Potassium: 3.7 mmol/L (ref 3.5–5.1)
Sodium: 138 mmol/L (ref 135–145)
Total Bilirubin: 0.6 mg/dL (ref 0.3–1.2)
Total Protein: 7.3 g/dL (ref 6.5–8.1)

## 2022-10-14 LAB — POCT URINE DRUG SCREEN - MANUAL ENTRY (I-SCREEN)
POC Amphetamine UR: NOT DETECTED
POC Buprenorphine (BUP): NOT DETECTED
POC Cocaine UR: NOT DETECTED
POC Marijuana UR: POSITIVE — AB
POC Methadone UR: NOT DETECTED
POC Methamphetamine UR: NOT DETECTED
POC Morphine: NOT DETECTED
POC Oxazepam (BZO): NOT DETECTED
POC Oxycodone UR: POSITIVE — AB
POC Secobarbital (BAR): NOT DETECTED

## 2022-10-14 LAB — URINALYSIS, ROUTINE W REFLEX MICROSCOPIC
Bilirubin Urine: NEGATIVE
Glucose, UA: NEGATIVE mg/dL
Ketones, ur: NEGATIVE mg/dL
Nitrite: NEGATIVE
Protein, ur: NEGATIVE mg/dL
Specific Gravity, Urine: 1.01 (ref 1.005–1.030)
pH: 5 (ref 5.0–8.0)

## 2022-10-14 LAB — POCT PREGNANCY, URINE: Preg Test, Ur: NEGATIVE

## 2022-10-14 LAB — CBC WITH DIFFERENTIAL/PLATELET
Abs Immature Granulocytes: 0.02 10*3/uL (ref 0.00–0.07)
Basophils Absolute: 0 10*3/uL (ref 0.0–0.1)
Basophils Relative: 1 %
Eosinophils Absolute: 0.2 10*3/uL (ref 0.0–0.5)
Eosinophils Relative: 3 %
HCT: 36.5 % (ref 36.0–46.0)
Hemoglobin: 12.3 g/dL (ref 12.0–15.0)
Immature Granulocytes: 0 %
Lymphocytes Relative: 34 %
Lymphs Abs: 2.4 10*3/uL (ref 0.7–4.0)
MCH: 28.2 pg (ref 26.0–34.0)
MCHC: 33.7 g/dL (ref 30.0–36.0)
MCV: 83.7 fL (ref 80.0–100.0)
Monocytes Absolute: 0.4 10*3/uL (ref 0.1–1.0)
Monocytes Relative: 5 %
Neutro Abs: 4 10*3/uL (ref 1.7–7.7)
Neutrophils Relative %: 57 %
Platelets: 329 10*3/uL (ref 150–400)
RBC: 4.36 MIL/uL (ref 3.87–5.11)
RDW: 14.2 % (ref 11.5–15.5)
WBC: 7 10*3/uL (ref 4.0–10.5)
nRBC: 0 % (ref 0.0–0.2)

## 2022-10-14 LAB — LIPID PANEL
Cholesterol: 216 mg/dL — ABNORMAL HIGH (ref 0–200)
HDL: 52 mg/dL (ref >40)
LDL Cholesterol: 148 mg/dL — ABNORMAL HIGH (ref 0–99)
Total CHOL/HDL Ratio: 4.2 RATIO
Triglycerides: 81 mg/dL (ref <150)
VLDL: 16 mg/dL (ref 0–40)

## 2022-10-14 LAB — ETHANOL: Alcohol, Ethyl (B): <10 mg/dL (ref <10)

## 2022-10-14 LAB — TSH: TSH: 1.405 u[IU]/mL (ref 0.350–4.500)

## 2022-10-14 LAB — POC URINE PREG, ED: Preg Test, Ur: NEGATIVE

## 2022-10-14 LAB — MAGNESIUM: Magnesium: 2 mg/dL (ref 1.7–2.4)

## 2022-10-14 MED ORDER — ALUM & MAG HYDROXIDE-SIMETH 200-200-20 MG/5ML PO SUSP: 30.0000 mL | ORAL | Status: DC | PRN

## 2022-10-14 MED ORDER — FLUOXETINE HCL 20 MG PO CAPS
20.0000 mg | ORAL_CAPSULE | Freq: Once | ORAL | Status: AC
  Administered 2022-10-14: 20 mg via ORAL
  Filled 2022-10-14: qty 1

## 2022-10-14 MED ORDER — TRAZODONE HCL 50 MG PO TABS
50.0000 mg | ORAL_TABLET | Freq: Every evening | ORAL | Status: DC | PRN
  Administered 2022-10-14: 50 mg via ORAL
  Filled 2022-10-14 (×2): qty 1

## 2022-10-14 MED ORDER — ACETAMINOPHEN 325 MG PO TABS
650.0000 mg | ORAL_TABLET | Freq: Four times a day (QID) | ORAL | Status: DC | PRN
  Administered 2022-10-14: 650 mg via ORAL
  Filled 2022-10-14: qty 2

## 2022-10-14 MED ORDER — TRIPLE ANTIBIOTIC 3.5-400-5000 EX OINT
1.0000 | TOPICAL_OINTMENT | Freq: Two times a day (BID) | CUTANEOUS | Status: DC
  Administered 2022-10-14 – 2022-10-15 (×2): 1 via CUTANEOUS
  Filled 2022-10-14 (×2): qty 1

## 2022-10-14 MED ORDER — MAGNESIUM HYDROXIDE 400 MG/5ML PO SUSP: 30.0000 mL | Freq: Every day | ORAL | Status: DC | PRN

## 2022-10-14 MED ORDER — HYDROXYZINE HCL 25 MG PO TABS: 25.0000 mg | ORAL_TABLET | Freq: Three times a day (TID) | ORAL | Status: DC | PRN

## 2022-10-14 NOTE — ED Notes (Signed)
Patient was admitted to the observation unit. Patient was oriented to the unit. Patient denies SI/HI and AVH. Patient has a surgical incision on her left foot. Patient reported the sutures were removed on 09/12/2022. There is a small opening with serous drainage, there is not redness noted. Patient reports she is instructed to leave the foot open to air. Patient is being monitored for safety.
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
## 2022-10-14 NOTE — ED Notes (Signed)
Patient currently sleeping in the bed with no complain. Respirations are even and unlabored. Will continue to monitor for safety.

## 2022-10-14 NOTE — ED Provider Notes (Cosign Needed Addendum)
[facility] Continuous Assessment Admission H&P  Date: 10/14/22 Patient Name: Brooke Robinson MRN: [id number] Chief Complaint: Depression, suicidal ideation  Diagnoses:  Final diagnoses:  Severe episode of recurrent major depressive disorder, with psychotic features (HCC)  Suicidal ideation    HPI: Brooke Robinson 22 y.o. female patient presented to [facility] as a walk in accompanied by her mother Brooke Robinson [phone]) with complaints of depression and suicidal ideation  Brooke Robinson, 22 y.o., female patient seen face to face by this provider, chart reviewed, and consulted with Dr. Nelly Rout on 10/14/22.  On evaluation Brooke Robinson reports she mad a suicidal gesture today "I attempted to drive my car off bridge."  Reports she stopped herself and "Went to the place where my children's father killed himself and talk to him."  Patient states she has prior history of suicide attempt x 2 "The first one I drank Clorox, The second one overdosed on pills."  Patient also endorses auditory/visual hallucinations.  Reports she is seeing shadows, and hearing voices telling her to hurt herself "Telling me to do things I don't want to do.  Most of the time I hear them when I'm in the car telling me to drive off bridge, drive into something or someone. Sometimes I will hear when I'm at home telling me to grab things like a knife or pills."  Patient also reports a history of self-harming behaviors (cutting) but states she hasn't cut since around 14 or 22 yrs old.  Patient states she feels that her primary stressor is she hasn't dealt with a lot of thing that has occurred like the death of her children's father.  Denies prior psychiatric hospitalization or outpatient psychiatric services.  Reports that her primary care provider prescribed Prozac last year but she stopped in around January or February of this year "because I was feeling better and didn't think I needed."  Patient states that it did help  and agrees to restart.  Patient states that she smokes marijuana "about every other day. One joint."  Patient states she has 2 children ages 55 and 3 yrs old.  She lives with her mother who is supportive and is employed.   During evaluation Brooke Robinson is seated in exam room with her mother at her side.  She gives permission for mother to sit in on assessment. Patient is dressed appropriate for weather with no noted distress.  There is puffiness around her eyes from crying.  She is alert/oriented x 4, calm, cooperative, attentive, and responses were relevant and appropriate to assessment questions.  She spoke in a clear tone at moderate volume, and normal pace, with good eye contact.   She denies homicidal ideation and paranoia but continues to endorse suicidal ideation and auditory/visual hallucinations.  Objectively there is no evidence of psychosis/mania or delusional thinking other than patients endorsement of auditory/visual hallucinations.  She conversed coherently, with goal directed thoughts, and no distractibility, or pre-occupation.  Recommended for inpatient psychiatric treatment    Total Time spent with patient: 45 minutes  Musculoskeletal  Strength & Muscle Tone: within normal limits Gait & Station: normal Patient leans: N/A  Psychiatric Specialty Exam  Presentation General Appearance:  Appropriate for Environment  Eye Contact: Good  Speech: Clear and Coherent; Normal Rate  Speech Volume: Normal  Handedness: Right   Mood and Affect  Mood: Anxious; Depressed; Hopeless  Affect: Congruent; Depressed; Flat   Thought Process  Thought Processes: Coherent; Goal Directed  Descriptions of Associations:Intact  Orientation:Full (Time, Place  and Person)  Thought Content:Logical  Diagnosis of Schizophrenia or Schizoaffective disorder in past: No   Hallucinations:Hallucinations: Auditory; Visual Description of Auditory Hallucinations: Reports she is hearing voices  telling her to do things that she don't want to do like hurt herself Description of Visual Hallucinations: Reports she is seeing shadows  Ideas of Reference:None  Suicidal Thoughts:Suicidal Thoughts: Yes, Active SI Active Intent and/or Plan: With Intent; With Plan; With Means to Carry Out  Homicidal Thoughts:Homicidal Thoughts: No   Sensorium  Memory: Immediate Good; Recent Good; Remote Good  Judgment: Fair  Insight: Fair; Present   Executive Functions  Concentration: Good  Attention Span: Good  Recall: Good  Fund of Knowledge: Good  Language: Good   Psychomotor Activity  Psychomotor Activity: Psychomotor Activity: Normal   Assets  Assets: Communication Skills; Desire for Improvement; Financial Resources/Insurance; Housing; Leisure Time; Physical Health; Resilience; Social Support; Transportation   Sleep  Sleep: Sleep: Poor Number of Hours of Sleep: 3   Nutritional Assessment (For OBS and FBC admissions only) Has the patient had a weight loss or gain of 10 pounds or more in the last 3 months?: No Has the patient had a decrease in food intake/or appetite?: No Does the patient have dental problems?: No Does the patient have eating habits or behaviors that may be indicators of an eating disorder including binging or inducing vomiting?: No Has the patient recently lost weight without trying?: 0 Has the patient been eating poorly because of a decreased appetite?: 0 Malnutrition Screening Tool Score: 0    Physical Exam Vitals and nursing note reviewed. Chaperone present: mother present.  Constitutional:      General: She is not in acute distress.    Appearance: Normal appearance. She is not ill-appearing.  HENT:     Head: Normocephalic.  Eyes:     Conjunctiva/sclera: Conjunctivae normal.     Comments: Puffiness from crying  Cardiovascular:     Rate and Rhythm: Normal rate.     Comments: Elevated blood pressure  Pulmonary:     Effort:  Pulmonary effort is normal.  Musculoskeletal:        General: Normal range of motion.     Cervical back: Normal range of motion.  Skin:    General: Skin is warm and dry.     Comments: Left great toe healing surgical wound.  No noted infection but still has a small opening.  Will order neosporin to apply to area informed to keep clean and dry   Neurological:     Mental Status: She is alert and oriented to person, place, and time.  Psychiatric:        Attention and Perception: She perceives auditory and visual hallucinations.        Mood and Affect: Mood is anxious and depressed. Affect is tearful.        Speech: Speech normal.        Behavior: Behavior normal. Behavior is cooperative.        Thought Content: Thought content is not paranoid or delusional. Thought content includes suicidal ideation. Thought content does not include homicidal ideation. Thought content includes suicidal plan.        Cognition and Memory: Cognition and memory normal.        Judgment: Judgment is impulsive.    Review of Systems  Constitutional:        No other complaints voiced  Psychiatric/Behavioral:  Positive for depression, hallucinations (hearing voices and seeing shadows), substance abuse (Smokes marijuana "a joint about  every other day") and suicidal ideas (With intent and plan.  Suicidal gesture made today). The patient is nervous/anxious and has insomnia.   All other systems reviewed and are negative.   Blood pressure (!) 145/79, pulse 66, temperature 98.6 F (37 C), temperature source Oral, resp. rate 20, SpO2 100%. There is no height or weight on file to calculate BMI.  Past Psychiatric History: Depression, anxiety, suicide attempt x 2   Is the patient at risk to self? Yes  Has the patient been a risk to self in the past 6 months? No .    Has the patient been a risk to self within the distant past? Yes   Is the patient a risk to others? No   Has the patient been a risk to others in the past 6  months? No   Has the patient been a risk to others within the distant past? No   Past Medical History:  Past Medical History:  Diagnosis Date   Herpes    Hypertension    Seasonal allergies      Family History:  Family History  Problem Relation Age of Onset   Healthy Mother      Social History: Lives with mother and 2 children, employed Social History   Tobacco Use   Smoking status: Never   Smokeless tobacco: Never  Vaping Use   Vaping status: Never Used  Substance Use Topics   Alcohol use: Never   Drug use: Never     Last Labs:  Admission on 10/14/2022  Component Date Value Ref Range Status   WBC 10/14/2022 7.0  4.0 - 10.5 K/uL Final   RBC 10/14/2022 4.36  3.87 - 5.11 MIL/uL Final   Hemoglobin 10/14/2022 12.3  12.0 - 15.0 g/dL Final   HCT 16/12/9602 36.5  36.0 - 46.0 % Final   MCV 10/14/2022 83.7  80.0 - 100.0 fL Final   MCH 10/14/2022 28.2  26.0 - 34.0 pg Final   MCHC 10/14/2022 33.7  30.0 - 36.0 g/dL Final   RDW 54/11/8117 14.2  11.5 - 15.5 % Final   Platelets 10/14/2022 329  150 - 400 K/uL Final   nRBC 10/14/2022 0.0  0.0 - 0.2 % Final   Neutrophils Relative % 10/14/2022 57  % Final   Neutro Abs 10/14/2022 4.0  1.7 - 7.7 K/uL Final   Lymphocytes Relative 10/14/2022 34  % Final   Lymphs Abs 10/14/2022 2.4  0.7 - 4.0 K/uL Final   Monocytes Relative 10/14/2022 5  % Final   Monocytes Absolute 10/14/2022 0.4  0.1 - 1.0 K/uL Final   Eosinophils Relative 10/14/2022 3  % Final   Eosinophils Absolute 10/14/2022 0.2  0.0 - 0.5 K/uL Final   Basophils Relative 10/14/2022 1  % Final   Basophils Absolute 10/14/2022 0.0  0.0 - 0.1 K/uL Final   Immature Granulocytes 10/14/2022 0  % Final   Abs Immature Granulocytes 10/14/2022 0.02  0.00 - 0.07 K/uL Final   Performed at [facility], [address] [place], [place] [address]   Color, Urine 10/14/2022 YELLOW  YELLOW Final   APPearance 10/14/2022 CLEAR  CLEAR Final   Specific Gravity, Urine 10/14/2022 1.010  1.005 -  1.030 Final   pH 10/14/2022 5.0  5.0 - 8.0 Final   Glucose, UA 10/14/2022 NEGATIVE  NEGATIVE mg/dL Final   Hgb urine dipstick 10/14/2022 LARGE (A)  NEGATIVE Final   Bilirubin Urine 10/14/2022 NEGATIVE  NEGATIVE Final   Ketones, ur 10/14/2022 NEGATIVE  NEGATIVE mg/dL Final   Protein, ur 56/21/3086 NEGATIVE  NEGATIVE mg/dL Final   Nitrite 57/84/6962 NEGATIVE  NEGATIVE Final   Leukocytes,Ua 10/14/2022 MODERATE (A)  NEGATIVE Final   RBC / HPF 10/14/2022 0-5  0 - 5 RBC/hpf Final   WBC, UA 10/14/2022 0-5  0 - 5 WBC/hpf Final   Bacteria, UA 10/14/2022 RARE (A)  NONE SEEN Final   Squamous Epithelial / HPF 10/14/2022 0-5  0 - 5 /HPF Final   Performed at [facility], [address], [place], [place] [address]   Preg Test, Ur 10/14/2022 Negative  Negative Final   POC Amphetamine UR 10/14/2022 None Detected  NONE DETECTED (Cut Off Level 1000 ng/mL) Final   POC Secobarbital (BAR) 10/14/2022 None Detected  NONE DETECTED (Cut Off Level 300 ng/mL) Final   POC Buprenorphine (BUP) 10/14/2022 None Detected  NONE DETECTED (Cut Off Level 10 ng/mL) Final   POC Oxazepam (BZO) 10/14/2022 None Detected  NONE DETECTED (Cut Off Level 300 ng/mL) Final   POC Cocaine UR 10/14/2022 None Detected  NONE DETECTED (Cut Off Level 300 ng/mL) Final   POC Methamphetamine UR 10/14/2022 None Detected  NONE DETECTED (Cut Off Level 1000 ng/mL) Final   POC Morphine 10/14/2022 None Detected  NONE DETECTED (Cut Off Level 300 ng/mL) Final   POC Methadone UR 10/14/2022 None Detected  NONE DETECTED (Cut Off Level 300 ng/mL) Final   POC Oxycodone UR 10/14/2022 Positive (A)  NONE DETECTED (Cut Off Level 100 ng/mL) Final   POC Marijuana UR 10/14/2022 Positive (A)  NONE DETECTED (Cut Off Level 50 ng/mL) Final   Preg Test, Ur 10/14/2022 NEGATIVE  NEGATIVE Final   Comment:        THE SENSITIVITY OF THIS METHODOLOGY IS >24 mIU/mL   Admission on 08/06/2022, Discharged on 08/06/2022  Component Date Value Ref Range Status   WBC  08/06/2022 6.6  4.0 - 10.5 K/uL Final   RBC 08/06/2022 4.23  3.87 - 5.11 MIL/uL Final   Hemoglobin 08/06/2022 12.0  12.0 - 15.0 g/dL Final   HCT 13/24/4010 35.7 (L)  36.0 - 46.0 % Final   MCV 08/06/2022 84.4  80.0 - 100.0 fL Final   MCH 08/06/2022 28.4  26.0 - 34.0 pg Final   MCHC 08/06/2022 33.6  30.0 - 36.0 g/dL Final   RDW 27/25/3664 13.5  11.5 - 15.5 % Final   Platelets 08/06/2022 340  150 - 400 K/uL Final   nRBC 08/06/2022 0.0  0.0 - 0.2 % Final   Neutrophils Relative % 08/06/2022 51  % Final   Neutro Abs 08/06/2022 3.3  1.7 - 7.7 K/uL Final   Lymphocytes Relative 08/06/2022 39  % Final   Lymphs Abs 08/06/2022 2.6  0.7 - 4.0 K/uL Final   Monocytes Relative 08/06/2022 6  % Final   Monocytes Absolute 08/06/2022 0.4  0.1 - 1.0 K/uL Final   Eosinophils Relative 08/06/2022 4  % Final   Eosinophils Absolute 08/06/2022 0.3  0.0 - 0.5 K/uL Final   Basophils Relative 08/06/2022 0  % Final   Basophils Absolute 08/06/2022 0.0  0.0 - 0.1 K/uL Final   Immature Granulocytes 08/06/2022 0  % Final   Abs Immature Granulocytes 08/06/2022 0.02  0.00 - 0.07 K/uL Final   Performed at [facility], [address] [place], [place] [address]   Preg, Serum 08/06/2022 NEGATIVE  NEGATIVE Final   Comment:        THE SENSITIVITY OF THIS METHODOLOGY IS >10 mIU/mL. Performed  at [facility], [address] [place], [place] [address]    Sodium 08/06/2022 136  135 - 145 mmol/L Final   Potassium 08/06/2022 3.6  3.5 - 5.1 mmol/L Final   Chloride 08/06/2022 106  98 - 111 mmol/L Final   CO2 08/06/2022 22  22 - 32 mmol/L Final   Glucose, Bld 08/06/2022 87  70 - 99 mg/dL Final   Glucose reference range applies only to samples taken after fasting for at least 8 hours.   BUN 08/06/2022 11  6 - 20 mg/dL Final   Creatinine, Ser 08/06/2022 0.78  0.44 - 1.00 mg/dL Final   Calcium 53/66/4403 9.2  8.9 - 10.3 mg/dL Final   Total Protein 47/42/5956 7.7  6.5 - 8.1 g/dL Final    Albumin 38/75/6433 4.1  3.5 - 5.0 g/dL Final   AST 29/51/8841 17  15 - 41 U/L Final   ALT 08/06/2022 17  0 - 44 U/L Final   Alkaline Phosphatase 08/06/2022 58  38 - 126 U/L Final   Total Bilirubin 08/06/2022 0.6  0.3 - 1.2 mg/dL Final   GFR, Estimated 08/06/2022 >60  >60 mL/min Final   Comment: (NOTE) Calculated using the CKD-EPI Creatinine Equation (2021)    Anion gap 08/06/2022 8  5 - 15 Final   Performed at [facility], [address] [place], [place] [address]   Color, Urine 08/06/2022 STRAW (A)  YELLOW Final   APPearance 08/06/2022 CLEAR  CLEAR Final   Specific Gravity, Urine 08/06/2022 1.009  1.005 - 1.030 Final   pH 08/06/2022 6.0  5.0 - 8.0 Final   Glucose, UA 08/06/2022 NEGATIVE  NEGATIVE mg/dL Final   Hgb urine dipstick 08/06/2022 NEGATIVE  NEGATIVE Final   Bilirubin Urine 08/06/2022 NEGATIVE  NEGATIVE Final   Ketones, ur 08/06/2022 NEGATIVE  NEGATIVE mg/dL Final   Protein, ur 01/60/1093 NEGATIVE  NEGATIVE mg/dL Final   Nitrite 23/55/7322 NEGATIVE  NEGATIVE Final   Leukocytes,Ua 08/06/2022 NEGATIVE  NEGATIVE Final   Performed at [facility], [address], [place], [place] [address]    Allergies: Patient has no known allergies.  Medications:  Facility Ordered Medications  Medication   acetaminophen (TYLENOL) tablet 650 mg   alum & mag hydroxide-simeth (MAALOX/MYLANTA) 200-200-20 MG/5ML suspension 30 mL   magnesium hydroxide (MILK OF MAGNESIA) suspension 30 mL   hydrOXYzine (ATARAX) tablet 25 mg   traZODone (DESYREL) tablet 50 mg   FLUoxetine (PROZAC) capsule 20 mg   neomycin-bacitracin-polymyxin [phone] OINT 1 Application   PTA Medications  Medication Sig   Prenatal Vit-Fe Fumarate-FA (PRENATAL MULTIVITAMIN) TABS tablet Take 1 tablet by mouth daily at 12 noon.   acetaminophen (TYLENOL) 325 MG tablet Take 2 tablets (650 mg total) by mouth every 4 (four) hours as needed (for pain scale < 4).   senna-docusate (SENOKOT-S)  8.6-50 MG tablet Take 2 tablets by mouth daily.   cetirizine (ZYRTEC) 10 MG tablet Take 1 tablet (10 mg total) by mouth daily.   fluticasone (FLONASE) 50 MCG/ACT nasal spray Place 1 spray into both nostrils daily.   lidocaine (XYLOCAINE) 2 % solution Use as directed 15 mLs in the mouth or throat as needed for mouth pain.   valACYclovir (VALTREX) 500 MG tablet Take 500 mg by mouth 2 (two) times daily.   silver sulfADIAZINE (SILVADENE) 1 % cream Apply 1 Application topically 2 (two) times daily.   predniSONE (STERAPRED UNI-PAK 21 TAB) 10 MG (21) TBPK tablet Take by mouth daily. Take 6  tabs by mouth daily  for 1 day, then 5 tabs for 1 day, then 4 tabs for 1 day, then 3 tabs for 1 day, 2 tabs for 1 day, then 1 tab by mouth daily for 1 days      Medical Decision Making  Nakota Abadie was admitted to [facility] continuous assessment unit for MDD (major depressive disorder), recurrent, severe, with psychosis (HCC), crisis management, and stabilization while awaiting appropriate bed for inpatient psychiatric treatment. Routine labs ordered, which include Lab Orders         CBC with Differential/Platelet         Comprehensive metabolic panel         Hemoglobin A1c         Magnesium         Ethanol         Lipid panel         TSH         Prolactin         Urinalysis, Routine w reflex microscopic -Urine, Clean Catch         POC urine preg, ED         POCT Urine Drug Screen - (I-Screen)         Pregnancy, urine POC    Medication Management: Restarted Prozac 20 mg daily for depression an anxiety.   Meds ordered this encounter  Medications   acetaminophen (TYLENOL) tablet 650 mg   alum & mag hydroxide-simeth (MAALOX/MYLANTA) 200-200-20 MG/5ML suspension 30 mL   magnesium hydroxide (MILK OF MAGNESIA) suspension 30 mL   hydrOXYzine (ATARAX) tablet 25 mg   traZODone (DESYREL) tablet 50 mg   FLUoxetine (PROZAC) capsule 20 mg   neomycin-bacitracin-polymyxin  [phone] OINT 1 Application   Will maintain continuous observation for safety. Social work will consult with patient on psychiatric hospitalization and discuss discharge and follow up plan.    Recommendations  Based on my evaluation the patient does not appear to have an emergency medical condition.  Brooke Selmon, NP 10/14/22  7:22 PM

## 2022-10-14 NOTE — Progress Notes (Signed)
   10/14/22 1608  [facility] Triage Screening (Walk-ins at [facility] only)  How Did You Hear About [place]? Family/Friend  What Is the Reason for Your Visit/Call Today? Pt presents to [facility] accompanied by her mother. Pt reports that she had lost her childrens father in June of last year. Pt mentions that she never got greif counseling for this trauma. Pt also mentions that she is interested in finding a therapist. Pt states, "I feel overstimulated at times" and that she is feels like she has no help. Pt reports she is living with her mother, because she had lost her apartment. Pt reports that she tried to harm herself this morning by trying to jump off a bridge. Pt reports that she is having visual and auditory hallucinations at this time, telling her to "jump off a bridge or get a knife or pills". Pt reports that when she is alone she feels the depression worsening. Pt reports that she is smoking cannabis every other day. Pt did consume wine last night and also smoked cannabis as well. Pt is currently wanting to end her life at this time, but uncertain of a plan. Pt is interested in trying medication at this time to help with her anxiety and depression. Pt denies HI at this time.  How Long Has This Been Causing You Problems? > than 6 months  Have You Recently Had Any Thoughts About Hurting Yourself? Yes  How long ago did you have thoughts about hurting yourself? today  Are You Planning to Commit Suicide/Harm Yourself At This time? Yes  Have you Recently Had Thoughts About Hurting Someone Karolee Ohs? No  Are You Planning To Harm Someone At This Time? No  Are you currently experiencing any auditory, visual or other hallucinations? Yes  Please explain the hallucinations you are currently experiencing: "jump off this bridge"  Have You Used Any Alcohol or Drugs in the Past 24 Hours? Yes  How long ago did you use Drugs or Alcohol? last night  What Did You Use and How Much? smoked cannabis, and drank a glass of wine  Do you have  any current medical co-morbidities that require immediate attention? No  Clinician description of patient physical appearance/behavior: pt is tearful, anxious, fairly groomed  What Do You Feel Would Help You the Most Today? Stress Management;Medication(s)  Determination of Need Emergent (2 hours)  Options For Referral Inpatient Hospitalization

## 2022-10-15 ENCOUNTER — Encounter (HOSPITAL_COMMUNITY): Payer: Self-pay | Admitting: Registered Nurse

## 2022-10-15 ENCOUNTER — Other Ambulatory Visit: Payer: Self-pay

## 2022-10-15 ENCOUNTER — Inpatient Hospital Stay (HOSPITAL_COMMUNITY)
Admission: AD | Admit: 2022-10-15 | Discharge: 2022-10-19 | DRG: 885 | Disposition: A | Discharge status: Home / Self Care | Payer: BC Managed Care – PPO | Source: Intra-hospital | Attending: Psychiatry | Admitting: Psychiatry

## 2022-10-15 DIAGNOSIS — Z5986 Financial insecurity: Secondary | ICD-10-CM

## 2022-10-15 DIAGNOSIS — R45851 Suicidal ideations: Secondary | ICD-10-CM | POA: Diagnosis present

## 2022-10-15 DIAGNOSIS — F1290 Cannabis use, unspecified, uncomplicated: Secondary | ICD-10-CM | POA: Insufficient documentation

## 2022-10-15 DIAGNOSIS — Z9151 Personal history of suicidal behavior: Secondary | ICD-10-CM | POA: Diagnosis not present

## 2022-10-15 DIAGNOSIS — F419 Anxiety disorder, unspecified: Secondary | ICD-10-CM | POA: Diagnosis present

## 2022-10-15 DIAGNOSIS — Z79899 Other long term (current) drug therapy: Secondary | ICD-10-CM | POA: Diagnosis not present

## 2022-10-15 DIAGNOSIS — F333 Major depressive disorder, recurrent, severe with psychotic symptoms: Principal | ICD-10-CM | POA: Diagnosis present

## 2022-10-15 DIAGNOSIS — I10 Essential (primary) hypertension: Secondary | ICD-10-CM | POA: Diagnosis present

## 2022-10-15 DIAGNOSIS — Z604 Social exclusion and rejection: Secondary | ICD-10-CM | POA: Diagnosis present

## 2022-10-15 DIAGNOSIS — F1210 Cannabis abuse, uncomplicated: Secondary | ICD-10-CM | POA: Diagnosis present

## 2022-10-15 DIAGNOSIS — N390 Urinary tract infection, site not specified: Secondary | ICD-10-CM | POA: Diagnosis present

## 2022-10-15 DIAGNOSIS — Z818 Family history of other mental and behavioral disorders: Secondary | ICD-10-CM

## 2022-10-15 MED ORDER — DIPHENHYDRAMINE HCL 25 MG PO CAPS: 50.0000 mg | ORAL_CAPSULE | Freq: Three times a day (TID) | ORAL | Status: DC | PRN

## 2022-10-15 MED ORDER — NITROFURANTOIN MONOHYD MACRO 100 MG PO CAPS: 100.0000 mg | ORAL_CAPSULE | Freq: Two times a day (BID) | ORAL | Status: DC

## 2022-10-15 MED ORDER — ACETAMINOPHEN 325 MG PO TABS
650.0000 mg | ORAL_TABLET | Freq: Four times a day (QID) | ORAL | Status: DC | PRN
  Administered 2022-10-15 – 2022-10-17 (×3): 650 mg via ORAL
  Filled 2022-10-15 (×3): qty 2

## 2022-10-15 MED ORDER — HYDROXYZINE HCL 25 MG PO TABS: 25.0000 mg | ORAL_TABLET | Freq: Three times a day (TID) | ORAL | Status: DC | PRN

## 2022-10-15 MED ORDER — NITROFURANTOIN MONOHYD MACRO 100 MG PO CAPS
100.0000 mg | ORAL_CAPSULE | Freq: Two times a day (BID) | ORAL | Status: DC
  Administered 2022-10-15 – 2022-10-19 (×8): 100 mg via ORAL
  Filled 2022-10-15 (×10): qty 1

## 2022-10-15 MED ORDER — ALUM & MAG HYDROXIDE-SIMETH 200-200-20 MG/5ML PO SUSP: 30.0000 mL | ORAL | Status: DC | PRN

## 2022-10-15 MED ORDER — FLUOXETINE HCL 20 MG PO CAPS
20.0000 mg | ORAL_CAPSULE | Freq: Every day | ORAL | Status: DC
  Administered 2022-10-15 – 2022-10-19 (×5): 20 mg via ORAL
  Filled 2022-10-15 (×7): qty 1

## 2022-10-15 MED ORDER — DIPHENHYDRAMINE HCL 50 MG/ML IJ SOLN: 50.0000 mg | Freq: Three times a day (TID) | INTRAMUSCULAR | Status: DC | PRN

## 2022-10-15 MED ORDER — TRIPLE ANTIBIOTIC 3.5-400-5000 EX OINT
1.0000 | TOPICAL_OINTMENT | Freq: Two times a day (BID) | CUTANEOUS | Status: DC
  Administered 2022-10-15 – 2022-10-19 (×8): 1 via CUTANEOUS
  Filled 2022-10-15 (×6): qty 1

## 2022-10-15 MED ORDER — TRAZODONE HCL 50 MG PO TABS
50.0000 mg | ORAL_TABLET | Freq: Every evening | ORAL | Status: DC | PRN
  Administered 2022-10-15 – 2022-10-18 (×4): 50 mg via ORAL
  Filled 2022-10-15 (×4): qty 1

## 2022-10-15 MED ORDER — NITROFURANTOIN MONOHYD MACRO 100 MG PO CAPS
100.0000 mg | ORAL_CAPSULE | Freq: Two times a day (BID) | ORAL | Status: DC
  Administered 2022-10-15: 100 mg via ORAL
  Filled 2022-10-15: qty 1

## 2022-10-15 MED ORDER — TRAZODONE HCL 50 MG PO TABS: 50.0000 mg | ORAL_TABLET | Freq: Every evening | ORAL | Status: DC | PRN

## 2022-10-15 MED ORDER — MAGNESIUM HYDROXIDE 400 MG/5ML PO SUSP: 30.0000 mL | Freq: Every day | ORAL | Status: DC | PRN

## 2022-10-15 NOTE — [facility] Group Notes (Signed)
Adult Psychoeducational Group Note  Date:  10/15/2022 Time:  9:48 PM  Group Topic/Focus:  Wrap-Up Group:   The focus of this group is to help patients review their daily goal of treatment and discuss progress on daily workbooks.  Participation Level:  Active  Participation Quality:  Appropriate  Affect:  Appropriate  Cognitive:  Appropriate  Insight: Appropriate  Engagement in Group:  Engaged  Modes of Intervention:  Discussion and Support  Additional Comments:  Pt told that today was a good day on the unit, the highlight of which was getting to hear her young children's voices on the phone. Being that Pt had only just arrived on the unit, she was oriented to the schedule and programming. She rated her day a 7 out of 10.  Christ Kick 10/15/2022, 9:48 PM

## 2022-10-15 NOTE — ED Notes (Signed)
Patient in milieu. Environment is secured. Will continue to monitor for safety. 

## 2022-10-15 NOTE — ED Notes (Signed)
Patient c/o feeling dizzy and nauseous. Vitals taken wnl. See documentation. Will report to incoming staff

## 2022-10-15 NOTE — Tx Team (Signed)
Initial Treatment Plan 10/15/2022 6:26 PM [name] GEX:[id number]    PATIENT STRESSORS: Financial difficulties   Traumatic event     PATIENT STRENGTHS: Capable of independent living  Motivation for treatment/growth    PATIENT IDENTIFIED PROBLEMS:   Suicide Attempt (Run car off a bridge)    Death of children's father last year     Financial stress    "Seeing shadow figures"       DISCHARGE CRITERIA:  Improved stabilization in mood, thinking, and/or behavior  PRELIMINARY DISCHARGE PLAN: Outpatient therapy  PATIENT/FAMILY INVOLVEMENT: This treatment plan has been presented to and reviewed with the patient, Brooke Robinson. The patient has been given the opportunity to ask questions and make suggestions.  Earma Reading Shakiera Edelson, RN 10/15/2022, 6:26 PM

## 2022-10-15 NOTE — Progress Notes (Addendum)
[facility] Admission Note:  Brooke Robinson is a 22 year old female voluntarily admitted to [facility] from [facility] on 10/15/22 following a suicide attempt by trying to run her car off of a bridge. Patient reports that voices in her head were telling her to drive her car off the bridge due to her grief from the loss of her children's father last year from suicide. Patient reports that she was able to talk herself out of killing herself because she realized that her children need a parent and so she decided to go to the place of her boyfriend's death and talk to him which made her feel better in the moment.   Patient reports that she is seeing shadows and also hearing voices telling her to hurt herself in various ways. Patient reports that the auditory and visual hallucinations started after the death of her boyfriend. Patient reports her boyfriend killed himself and that his family blames her for his death which has been difficult for her. Patient reports that her primary supporter is her mom and sister and she currently resides with them both as well as her 2 children.  Patient denies SI/HI/AVH on admission. Patient denies tobacco use. Patient reports that she drinks socially/ recreationally. Patient's UDS positive for oxycodone, marijuana. Patient has a laceration on the top of her left foot from a recent surgery. Patient reports that she has been putting neosporin on it and leaving it open to air per her doctor's orders and has a follow up appointment in place to have it looked at when she leaves here.  Patient oriented to unit. Q 15 minute safety checks in place.

## 2022-10-15 NOTE — Plan of Care (Signed)
  Problem: Education: Goal: Knowledge of [facility] Education information/materials will improve Outcome: Progressing Goal: Verbalization of understanding the information provided will improve Outcome: Progressing   

## 2022-10-15 NOTE — Progress Notes (Signed)
Pt has been accepted to [facility] TODAY 10/15/2022. Bed assignment: 305-2  Pt meets inpatient criteria per Assunta Found, NP  Attending Physician will be Phineas Inches, MD  Report can be called to: - Adult unit: [phone]  Pt can arrive after pending discharges  Care Team Notified: [facility] Rona Ravens, RN, Durwin Reges, RN, Florentina Addison, RN, Lorella Nimrod, RN, and Assunta Found, NP  Cathie Beams, LCSW  10/15/2022 11:11 AM

## 2022-10-15 NOTE — ED Notes (Signed)
Patient currently sleeping in the bed with no complain. Respirations are even and unlabored. Will continue to monitor for safety.

## 2022-10-15 NOTE — ED Notes (Signed)
Safe transport came an got patient

## 2022-10-15 NOTE — Progress Notes (Signed)
   10/15/22 2200  Psych Admission Type (Psych Patients Only)  Admission Status Voluntary  Psychosocial Assessment  Patient Complaints Anxiety  Eye Contact Fair  Facial Expression Animated  Affect Appropriate to circumstance  Speech Logical/coherent  Interaction Assertive  Motor Activity Slow  Appearance/Hygiene Unremarkable  Behavior Characteristics Cooperative;Appropriate to situation  Mood Pleasant  Thought Process  Coherency WDL  Content WDL  Delusions None reported or observed  Perception WDL  Hallucination None reported or observed  Judgment Poor  Confusion None  Danger to Self  Current suicidal ideation? Denies  Agreement Not to Harm Self Yes  Description of Agreement verbal  Danger to Others  Danger to Others None reported or observed

## 2022-10-15 NOTE — ED Notes (Signed)
Report called to vanessa rn [facility]

## 2022-10-15 NOTE — ED Notes (Signed)
Patient alert and oriented x 3. Denies SI/HI/AVH. Denies intent or plan to harm self or others. Routine conducted according to faculty protocol. Encourage patient to notify staff with any needs or concerns. Patient verbalized agreement and understanding. Will continue to monitor for safety. 

## 2022-10-15 NOTE — Discharge Instructions (Signed)
  Patient to be transferred to [facility] for inpatient psychiatric treatment 

## 2022-10-15 NOTE — ED Notes (Signed)
Patient A&O x 4, ambulatory. Patient discharged in no acute distress. Patient denied SI/HI, A/VH upon discharge. Patient escorted to lobby via staff for transport to destination. Safety maintained.

## 2022-10-15 NOTE — ED Notes (Signed)
Patient reported some dizziness. Rn notified provider . Vitals are stable will continue to monitor for safety.

## 2022-10-15 NOTE — Progress Notes (Signed)
Per Day [facility] Rona Ravens, RN pt is under review and can be accepted to [facility] 10/15/22 PENDING discharges.   Care Team notified: Day [facility] Danika Riley,RN, Night [facility] [place], Bethany Hendra,LCSW  Maryjean Ka, MSW, LCSWA 10/15/2022 1:50 AM

## 2022-10-15 NOTE — ED Notes (Signed)
Patient denies any dizziness at this time. Will continue to monitir.

## 2022-10-15 NOTE — ED Provider Notes (Signed)
FBC/OBS ASAP Discharge Summary  Date and Time: 10/15/2022 12:21 PM  Name: Brooke Robinson  MRN:  [id number]   Discharge Diagnoses:  Final diagnoses:  Severe episode of recurrent major depressive disorder, with psychotic features (HCC)  Suicidal ideation    Subjective:  [name] 22 y.o. female patient presented to [facility] as a walk in accompanied by her mother Francisca December [phone]) with complaints of depression and suicidal ideation   Carroll Sage reassessed face to face by this provider, chart reviewed, and consulted with Dr. Nelly Rout on 10/15/22.  On evaluation Kymbree Bas reports that she feels a little better this morning.  Currently she denies active suicidal ideation stating that they are more passive this morning but she is still unable to contract for safety.  Patient reports she slept well last night and eating without any difficulty.  Patient denies homicidal ideation, psychosis, paranoia.  Patient reports tolerating medications well with no adverse reactions. During evaluation Mikeala Giglia is lying in bed with no noted distress.  She is alert/oriented x 4, calm, cooperative, attentive, and responses were relevant and appropriate to assessment questions.  She spoke in a clear tone at moderate volume, and normal pace, with good eye contact.   She denies homicidal ideation, psychosis, and paranoia.  Continues to have suicidal ideation but more passive in nature today.  Patient still unable to contract for safety.  Objectively there is no evidence of psychosis/mania or delusional thinking.  She conversed coherently, with goal directed thoughts, and no distractibility, or pre-occupation.     Total Time spent with patient: 30 minutes  Past Psychiatric History: Depression, anxiety, suicide attempt x 2  Past Medical History:  Past Medical History:  Diagnosis Date   Herpes    Hypertension    Seasonal allergies     Family History: None reported Family  Psychiatric History: None reported Social History:  Lives with mother and 2 children, employed Social History   Tobacco Use   Smoking status: Never   Smokeless tobacco: Never  Vaping Use   Vaping status: Never Used  Substance Use Topics   Alcohol use: Never   Drug use: Never    Tobacco Cessation:  N/A, patient does not currently use tobacco products  Current Medications:  Current Facility-Administered Medications  Medication Dose Route Frequency Provider Last Rate Last Admin   acetaminophen (TYLENOL) tablet 650 mg  650 mg Oral Q6H PRN Janyla Biscoe B, NP   650 mg at 10/14/22 2318   alum & mag hydroxide-simeth (MAALOX/MYLANTA) 200-200-20 MG/5ML suspension 30 mL  30 mL Oral Q4H PRN Soraida Vickers B, NP       hydrOXYzine (ATARAX) tablet 25 mg  25 mg Oral TID PRN Amere Iott B, NP       magnesium hydroxide (MILK OF MAGNESIA) suspension 30 mL  30 mL Oral Daily PRN Abdiaziz Klahn B, NP       neomycin-bacitracin-polymyxin [phone] OINT 1 Application  1 Application Apply externally BID Linder Prajapati B, NP   1 Application at 10/15/22 0926   nitrofurantoin (macrocrystal-monohydrate) (MACROBID) capsule 100 mg  100 mg Oral Q12H Catelyn Friel B, NP   100 mg at 10/15/22 1126   traZODone (DESYREL) tablet 50 mg  50 mg Oral QHS PRN Chenita Ruda B, NP   50 mg at 10/14/22 2319   Current Outpatient Medications  Medication Sig Dispense Refill   albuterol (VENTOLIN HFA) 108 (90 Base) MCG/ACT inhaler Inhale 2 puffs into the lungs every 6 (six) hours as needed  for wheezing or shortness of breath.     cyclobenzaprine (FLEXERIL) 10 MG tablet Take 10 mg by mouth 3 (three) times daily as needed for muscle spasms.     Naphazoline HCl (CLEAR EYES OP) Place 2 drops into both eyes 4 (four) times daily as needed (For eye irritation due to allergies).     oxyCODONE-acetaminophen (PERCOCET/ROXICET) 5-325 MG tablet Take 1 tablet by mouth every 4 (four) hours as needed for moderate pain.     Probiotic Product  (PROBIOTIC DAILY PO) Take 1 capsule by mouth daily.     triamcinolone (NASACORT) 55 MCG/ACT AERO nasal inhaler Place 2 sprays into the nose daily.     valACYclovir (VALTREX) 1000 MG tablet Take 1,000 mg by mouth daily as needed (For outbreaks).      PTA Medications:  Facility Ordered Medications  Medication   acetaminophen (TYLENOL) tablet 650 mg   alum & mag hydroxide-simeth (MAALOX/MYLANTA) 200-200-20 MG/5ML suspension 30 mL   magnesium hydroxide (MILK OF MAGNESIA) suspension 30 mL   hydrOXYzine (ATARAX) tablet 25 mg   traZODone (DESYREL) tablet 50 mg   [COMPLETED] FLUoxetine (PROZAC) capsule 20 mg   neomycin-bacitracin-polymyxin [phone] OINT 1 Application   nitrofurantoin (macrocrystal-monohydrate) (MACROBID) capsule 100 mg   PTA Medications  Medication Sig   cyclobenzaprine (FLEXERIL) 10 MG tablet Take 10 mg by mouth 3 (three) times daily as needed for muscle spasms.   oxyCODONE-acetaminophen (PERCOCET/ROXICET) 5-325 MG tablet Take 1 tablet by mouth every 4 (four) hours as needed for moderate pain.   albuterol (VENTOLIN HFA) 108 (90 Base) MCG/ACT inhaler Inhale 2 puffs into the lungs every 6 (six) hours as needed for wheezing or shortness of breath.   triamcinolone (NASACORT) 55 MCG/ACT AERO nasal inhaler Place 2 sprays into the nose daily.   Probiotic Product (PROBIOTIC DAILY PO) Take 1 capsule by mouth daily.   valACYclovir (VALTREX) 1000 MG tablet Take 1,000 mg by mouth daily as needed (For outbreaks).   Naphazoline HCl (CLEAR EYES OP) Place 2 drops into both eyes 4 (four) times daily as needed (For eye irritation due to allergies).        No data to display          Flowsheet Row ED from 10/14/2022 in [facility] ED from 08/13/2022 in [facility] Care at [facility] ED from 08/06/2022 in [facility] Emergency Department at [facility]  C-SSRS RISK CATEGORY No Risk No Risk No Risk       Musculoskeletal   Strength & Muscle Tone: within normal limits Gait & Station: normal Patient leans: N/A  Psychiatric Specialty Exam  Presentation  General Appearance:  Appropriate for Environment  Eye Contact: Good  Speech: Clear and Coherent; Normal Rate  Speech Volume: Normal  Handedness: Right   Mood and Affect  Mood: Depressed  Affect: Congruent   Thought Process  Thought Processes: Coherent; Goal Directed  Descriptions of Associations:Intact  Orientation:Full (Time, Place and Person)  Thought Content:Logical  Diagnosis of Schizophrenia or Schizoaffective disorder in past: No    Hallucinations:Hallucinations: None (Denies at this time) Description of Auditory Hallucinations: Reports she is hearing voices telling her to do things that she don't want to do like hurt herself Description of Visual Hallucinations: Reports she is seeing shadows  Ideas of Reference:None  Suicidal Thoughts:Suicidal Thoughts: Yes, Passive (Reports more passive today but still unable to contract for safety) SI Active Intent and/or Plan: With Intent; With Plan; With Means to Carry Out  Homicidal  Thoughts:Homicidal Thoughts: No   Sensorium  Memory: Immediate Good; Recent Good; Remote Good  Judgment: Fair  Insight: Fair; Present   Executive Functions  Concentration: Good  Attention Span: Good  Recall: Good  Fund of Knowledge: Good  Language: Good   Psychomotor Activity  Psychomotor Activity: Psychomotor Activity: Normal   Assets  Assets: Communication Skills; Desire for Improvement; Financial Resources/Insurance; Housing; Physical Health; Resilience; Social Support; Transportation   Sleep  Sleep: Sleep: Poor Number of Hours of Sleep: 3   Nutritional Assessment (For OBS and FBC admissions only) Has the patient had a weight loss or gain of 10 pounds or more in the last 3 months?: No Has the patient had a decrease in food intake/or appetite?: No Does the  patient have dental problems?: No Does the patient have eating habits or behaviors that may be indicators of an eating disorder including binging or inducing vomiting?: No Has the patient recently lost weight without trying?: 0 Has the patient been eating poorly because of a decreased appetite?: 0 Malnutrition Screening Tool Score: 0    Physical Exam  Physical Exam Vitals and nursing note reviewed. Chaperone present: mother present.  Constitutional:      General: She is not in acute distress.    Appearance: Normal appearance. She is not ill-appearing.  HENT:     Head: Normocephalic.  Eyes:     Conjunctiva/sclera: Conjunctivae normal.     Comments: Puffiness from crying  Cardiovascular:     Rate and Rhythm: Normal rate.     Comments: Elevated blood pressure  Pulmonary:     Effort: Pulmonary effort is normal.  Musculoskeletal:        General: Normal range of motion.     Cervical back: Normal range of motion.  Skin:    General: Skin is warm and dry.     Comments: Left great toe healing surgical wound.  No noted infection but still has a small opening.  Will order neosporin to apply to area informed to keep clean and dry   Neurological:     Mental Status: She is alert and oriented to person, place, and time.  Psychiatric:        Attention and Perception: She does not perceive auditory (Denies at this time) or visual (Denies at this time) hallucinations.        Mood and Affect: Mood is anxious and depressed. Affect is tearful.        Speech: Speech normal.        Behavior: Behavior normal. Behavior is cooperative.        Thought Content: Thought content is not paranoid or delusional. Thought content includes suicidal (Reports suidical ideaton more passive at this time) ideation. Thought content does not include homicidal ideation. Thought content includes suicidal plan.        Cognition and Memory: Cognition and memory normal.        Judgment: Judgment is impulsive.    Review of  Systems  Constitutional:        No other complaints voiced  Psychiatric/Behavioral:  Positive for depression, substance abuse (Smokes marijuana "a joint about every other day") and suicidal ideas (Reports thoughts are more passive today but unable to contract for safety). Negative for hallucinations (Denies at this time). The patient is nervous/anxious and has insomnia.   All other systems reviewed and are negative.  Blood pressure 126/82, pulse (!) 57, temperature 98.8 F (37.1 C), temperature source Oral, resp. rate 19, SpO2 100%. There is no height  or weight on file to calculate BMI.  Disposition: Patient recommended for inpatient psychiatric treatment and has been accepted to [facility]  Sadonna Kotara, NP 10/15/2022, 12:21 PM

## 2022-10-16 ENCOUNTER — Encounter (HOSPITAL_COMMUNITY): Payer: Self-pay

## 2022-10-16 DIAGNOSIS — F333 Major depressive disorder, recurrent, severe with psychotic symptoms: Secondary | ICD-10-CM | POA: Diagnosis not present

## 2022-10-16 DIAGNOSIS — F1290 Cannabis use, unspecified, uncomplicated: Secondary | ICD-10-CM | POA: Insufficient documentation

## 2022-10-16 NOTE — Progress Notes (Signed)
D: Patient is alert, oriented, pleasant, and cooperative. Denies SI, HI, AVH, and verbally contracts for safety. She did take a while to wake up this morning. Patient reports she slept good last night. Patient reports her appetite as fair, energy level as normal, and concentration as good. Patient rates her depression 3/10, hopelessness 2/10, and anxiety 4/10.    A: Scheduled medications administered per MD order. Support provided. Patient educated on safety on the unit and medications. Routine safety checks every 15 minutes. Patient stated understanding to tell nurse about any new physical symptoms. Patient understands to tell staff of any needs.     R: No adverse drug reactions noted. Patient verbally contracts for safety. Patient remains safe at this time and will continue to monitor.    10/16/22 1300  Psych Admission Type (Psych Patients Only)  Admission Status Voluntary  Psychosocial Assessment  Patient Complaints None  Eye Contact Fair  Facial Expression Animated  Affect Appropriate to circumstance  Speech Logical/coherent  Interaction Assertive  Motor Activity Other (Comment) (WNL)  Appearance/Hygiene Unremarkable  Behavior Characteristics Cooperative;Appropriate to situation  Mood Pleasant  Thought Process  Coherency WDL  Content WDL  Delusions None reported or observed  Perception WDL  Hallucination None reported or observed  Judgment Limited  Confusion None  Danger to Self  Current suicidal ideation? Denies  Agreement Not to Harm Self Yes  Description of Agreement verbal  Danger to Others  Danger to Others None reported or observed

## 2022-10-16 NOTE — [facility] Group Notes (Signed)
Adult Psychoeducational Group Note  Date:  10/16/2022 Time:  10:18 PM  Group Topic/Focus:  Wrap-Up Group:   The focus of this group is to help patients review their daily goal of treatment and discuss progress on daily workbooks.  Participation Level:  Active  Participation Quality:  Attentive  Affect:  Appropriate  Cognitive:  Alert  Insight: Improving  Engagement in Group:  Engaged  Modes of Intervention:  Discussion  Additional Comments:  Pt attended and participated in group.  Maura Crandall Cassandra 10/16/2022, 10:18 PM

## 2022-10-16 NOTE — Plan of Care (Signed)

## 2022-10-16 NOTE — BH IP Treatment Plan (Signed)
Interdisciplinary Treatment and Diagnostic Plan Initial  10/16/2022 Time of Session: 1125 Brooke Robinson MRN: [id number]  Principal Diagnosis: MDD (major depressive disorder), recurrent, severe, with psychosis (HCC)  Secondary Diagnoses: Principal Problem:   MDD (major depressive disorder), recurrent, severe, with psychosis (HCC) Active Problems:   Cannabis use disorder   Current Medications:  Current Facility-Administered Medications  Medication Dose Route Frequency Provider Last Rate Last Admin   acetaminophen (TYLENOL) tablet 650 mg  650 mg Oral Q6H PRN Rankin, Shuvon B, NP   650 mg at 10/15/22 1716   alum & mag hydroxide-simeth (MAALOX/MYLANTA) 200-200-20 MG/5ML suspension 30 mL  30 mL Oral Q4H PRN Rankin, Shuvon B, NP       diphenhydrAMINE (BENADRYL) capsule 50 mg  50 mg Oral TID PRN Rankin, Shuvon B, NP       Or   diphenhydrAMINE (BENADRYL) injection 50 mg  50 mg Intramuscular TID PRN Rankin, Shuvon B, NP       FLUoxetine (PROZAC) capsule 20 mg  20 mg Oral Daily Rankin, Shuvon B, NP   20 mg at 10/16/22 0102   hydrOXYzine (ATARAX) tablet 25 mg  25 mg Oral TID PRN Rankin, Shuvon B, NP       magnesium hydroxide (MILK OF MAGNESIA) suspension 30 mL  30 mL Oral Daily PRN Rankin, Shuvon B, NP       neomycin-bacitracin-polymyxin [phone] OINT 1 Application  1 Application Apply externally BID Rankin, Shuvon B, NP   1 Application at 10/16/22 0959   nitrofurantoin (macrocrystal-monohydrate) (MACROBID) capsule 100 mg  100 mg Oral Q12H Rankin, Shuvon B, NP   100 mg at 10/16/22 0958   traZODone (DESYREL) tablet 50 mg  50 mg Oral QHS PRN Rankin, Shuvon B, NP   50 mg at 10/15/22 2123   PTA Medications: Medications Prior to Admission  Medication Sig Dispense Refill Last Dose   albuterol (VENTOLIN HFA) 108 (90 Base) MCG/ACT inhaler Inhale 2 puffs into the lungs every 6 (six) hours as needed for wheezing or shortness of breath.      cyclobenzaprine (FLEXERIL) 10 MG tablet Take 10 mg by mouth  3 (three) times daily as needed for muscle spasms.      hydrOXYzine (ATARAX) 25 MG tablet Take 1 tablet (25 mg total) by mouth 3 (three) times daily as needed for anxiety.      Naphazoline HCl (CLEAR EYES OP) Place 2 drops into both eyes 4 (four) times daily as needed (For eye irritation due to allergies).      nitrofurantoin, macrocrystal-monohydrate, (MACROBID) 100 MG capsule Take 1 capsule (100 mg total) by mouth every 12 (twelve) hours.      oxyCODONE-acetaminophen (PERCOCET/ROXICET) 5-325 MG tablet Take 1 tablet by mouth every 4 (four) hours as needed for moderate pain.      Probiotic Product (PROBIOTIC DAILY PO) Take 1 capsule by mouth daily.      traZODone (DESYREL) 50 MG tablet Take 1 tablet (50 mg total) by mouth at bedtime as needed for sleep.      triamcinolone (NASACORT) 55 MCG/ACT AERO nasal inhaler Place 2 sprays into the nose daily.      valACYclovir (VALTREX) 1000 MG tablet Take 1,000 mg by mouth daily as needed (For outbreaks).       Patient Stressors: Financial difficulties   Traumatic event    Patient Strengths: Capable of independent living  Motivation for treatment/growth   Treatment Modalities: Medication Management, Group therapy, Case management,  1 to 1 session with clinician, Psychoeducation, Recreational therapy.  Physician Treatment Plan for Primary Diagnosis: MDD (major depressive disorder), recurrent, severe, with psychosis (HCC) Long Term Goal(s): Improvement in symptoms so as ready for discharge   Short Term Goals: Ability to identify and develop effective coping behaviors will improve Ability to maintain clinical measurements within normal limits will improve Compliance with prescribed medications will improve Ability to identify triggers associated with substance abuse/mental health issues will improve Ability to identify changes in lifestyle to reduce recurrence of condition will improve Ability to verbalize feelings will improve Ability to disclose  and discuss suicidal ideas Ability to demonstrate self-control will improve  Medication Management: Evaluate patient's response, side effects, and tolerance of medication regimen.  Therapeutic Interventions: 1 to 1 sessions, Unit Group sessions and Medication administration.  Evaluation of Outcomes: Progressing  Physician Treatment Plan for Secondary Diagnosis: Principal Problem:   MDD (major depressive disorder), recurrent, severe, with psychosis (HCC) Active Problems:   Cannabis use disorder  Long Term Goal(s): Improvement in symptoms so as ready for discharge   Short Term Goals: Ability to identify and develop effective coping behaviors will improve Ability to maintain clinical measurements within normal limits will improve Compliance with prescribed medications will improve Ability to identify triggers associated with substance abuse/mental health issues will improve Ability to identify changes in lifestyle to reduce recurrence of condition will improve Ability to verbalize feelings will improve Ability to disclose and discuss suicidal ideas Ability to demonstrate self-control will improve     Medication Management: Evaluate patient's response, side effects, and tolerance of medication regimen.  Therapeutic Interventions: 1 to 1 sessions, Unit Group sessions and Medication administration.  Evaluation of Outcomes: Progressing   RN Treatment Plan for Primary Diagnosis: MDD (major depressive disorder), recurrent, severe, with psychosis (HCC) Long Term Goal(s): Knowledge of disease and therapeutic regimen to maintain health will improve  Short Term Goals: Ability to remain free from injury will improve, Ability to verbalize frustration and anger appropriately will improve, Ability to demonstrate self-control, Ability to participate in decision making will improve, Ability to verbalize feelings will improve, Ability to disclose and discuss suicidal ideas, Ability to identify and  develop effective coping behaviors will improve, and Compliance with prescribed medications will improve  Medication Management: RN will administer medications as ordered by provider, will assess and evaluate patient's response and provide education to patient for prescribed medication. RN will report any adverse and/or side effects to prescribing provider.  Therapeutic Interventions: 1 on 1 counseling sessions, Psychoeducation, Medication administration, Evaluate responses to treatment, Monitor vital signs and CBGs as ordered, Perform/monitor CIWA, COWS, AIMS and Fall Risk screenings as ordered, Perform wound care treatments as ordered.  Evaluation of Outcomes: Progressing   LCSW Treatment Plan for Primary Diagnosis: MDD (major depressive disorder), recurrent, severe, with psychosis (HCC) Long Term Goal(s): Safe transition to appropriate next level of care at discharge, Engage patient in therapeutic group addressing interpersonal concerns.  Short Term Goals: Engage patient in aftercare planning with referrals and resources, Increase social support, Increase ability to appropriately verbalize feelings, Increase emotional regulation, Facilitate acceptance of mental health diagnosis and concerns, Facilitate patient progression through stages of change regarding substance use diagnoses and concerns, Identify triggers associated with mental health/substance abuse issues, and Increase skills for wellness and recovery  Therapeutic Interventions: Assess for all discharge needs, 1 to 1 time with Social worker, Explore available resources and support systems, Assess for adequacy in community support network, Educate family and significant other(s) on suicide prevention, Complete Psychosocial Assessment, Interpersonal group therapy.  Evaluation of Outcomes:  Progressing   Progress in Treatment: Attending groups: Yes. Participating in groups: Yes. Taking medication as prescribed: Yes. Toleration  medication: Yes. Family/Significant other contact made: No, will contact:  Once consents are signed Patient understands diagnosis: Yes. Discussing patient identified problems/goals with staff: Yes. Medical problems stabilized or resolved: Yes. Denies suicidal/homicidal ideation: Yes. Issues/concerns per patient self-inventory: Yes. Other: N/A  New problem(s) identified: No, Describe:  None Reported  New Short Term/Long Term Goal(s):medication stabilization, elimination of SI thoughts, development of comprehensive mental wellness plan.   Patient Goals:  Coping Skills  Discharge Plan or Barriers:   Reason for Continuation of Hospitalization: Anxiety Depression Medication stabilization Suicidal ideation Withdrawal symptoms  Estimated Length of Stay: 3-7 Days  Last 3 [place] Suicide Severity Risk Score: Flowsheet Row Ad[facility] (Current) [facility]14[facility]2022 in [facility]13/20[facility])  C-SSRS RISK CATEGORY High Risk No Risk No Risk       Last PHQ 2/9 Scores:     No data to display         detox, medication management for mood stabilization; elimination of SI thoughts; development of comprehensive mental wellness/sobriety plan  Brooke Freshwater Jaydence Arnesen, LCSW 10/16/2022 1:50 PM
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
## 2022-10-17 DIAGNOSIS — F333 Major depressive disorder, recurrent, severe with psychotic symptoms: Secondary | ICD-10-CM | POA: Diagnosis not present

## 2022-10-17 NOTE — Plan of Care (Signed)
  Problem: Education: Goal: Knowledge of [facility] [place] [facility] Education information/materials will improve Outcome: Progressing Goal: Emotional status will improve Outcome: Progressing   Problem: Activity: Goal: Sleeping patterns will improve Outcome: Progressing   

## 2022-10-17 NOTE — Group Note (Signed)
Date:  10/17/2022 Time:  10:15 AM  Group Topic/Focus:  Goals Group:   The focus of this group is to help patients establish daily goals to achieve during treatment and discuss how the patient can incorporate goal setting into their daily lives to aide in recovery.    Participation Level:  Did Not Attend  Participation Quality:      Affect:      Cognitive:      Insight: None  Engagement in Group  Modes of Intervention:      Additional Comments:     Reymundo Poll 10/17/2022, 10:15 AM

## 2022-10-17 NOTE — Group Note (Signed)
[facility] LCSW Group Therapy Note   Group Date: 10/17/2022 Start Time: 1100 End Time: 1200   Type of Therapy/Topic:  Group Therapy:  Emotion Regulation  Participation Level:  Active   Mood:  Description of Group:    The purpose of this group is to assist patients in learning to regulate negative emotions and experience positive emotions. Patients will be guided to discuss ways in which they have been vulnerable to their negative emotions. These vulnerabilities will be juxtaposed with experiences of positive emotions or situations, and patients challenged to use positive emotions to combat negative ones. Special emphasis will be placed on coping with negative emotions in conflict situations, and patients will process healthy conflict resolution skills.  Therapeutic Goals: Patient will identify two positive emotions or experiences to reflect on in order to balance out negative emotions:  Patient will label two or more emotions that they find the most difficult to experience:  Patient will be able to demonstrate positive conflict resolution skills through discussion or role plays:   Summary of Patient Progress:   Patient was active and engaged in group discussions.     Therapeutic Modalities:   Cognitive Behavioral Therapy Feelings Identification Dialectical Behavioral Therapy   Starleen Arms, LCSW

## 2022-10-17 NOTE — Plan of Care (Signed)
Nurse discussed anxiety with pt

## 2022-10-17 NOTE — [facility] Group Notes (Signed)
Spiritual care group on grief and loss facilitated by Chaplain Dyanne Carrel, [facility]  Group Goal: Support / Education around grief and loss  Members engage in facilitated group support and psycho-social education.  Group Description:  Following introductions and group rules, group members engaged in facilitated group dialogue and support around topic of loss, with particular support around experiences of loss in their lives. Group Identified types of loss (relationships / self / things) and identified patterns, circumstances, and changes that precipitate losses. Reflected on thoughts / feelings around loss, normalized grief responses, and recognized variety in grief experience. Group encouraged individual reflection on safe space and on the coping skills that they are already utilizing.  Group drew on Adlerian / Rogerian and narrative framework  Patient Progress: Brooke Robinson attended group and actively engaged and participated in group conversation and activities.  She shared about the loss of her SO by suicide and how she is coping individually and helping her children cope. Her comments demonstrated good insight and she was supportive of peers.

## 2022-10-17 NOTE — [facility] Group Notes (Signed)
[facility] Group Notes:  (Nursing/MHT/Case Management/Adjunct)  Date:  10/17/2022  Time:  9:08 PM  Type of Therapy:  Wrap Up group  Participation Level:  Active  Participation Quality:  Appropriate  Affect:  Appropriate  Cognitive:  Appropriate  Insight:  Improving  Engagement in Group:  Improving  Modes of Intervention:  Discussion  Summary of Progress/Problems:she said that her day was a 10, she enjoyed the gym   Kassi Esteve E Georgios Kina 10/17/2022, 9:08 PM

## 2022-10-17 NOTE — Progress Notes (Signed)
   10/17/22 0815  Sleep (Behavioral Health Patients Only)  Calculate sleep? (Click Yes once per 24 hr at 0600 safety check) Yes  Documented sleep last 24 hours 6.5

## 2022-10-17 NOTE — Group Note (Signed)
Date:  10/17/2022 Time:  10:06 AM  Group Topic/Focus:  Goals Group:   The focus of this group is to help patients establish daily goals to achieve during treatment and discuss how the patient can incorporate goal setting into their daily lives to aide in recovery.    Participation Level:  Did Not Attend  Participation Quality:      Affect:      Cognitive:      Insight: None  Engagement in Group:    Modes of Intervention:      Additional Comments:     Reymundo Poll 10/17/2022, 10:06 AM

## 2022-10-17 NOTE — Progress Notes (Signed)
   10/16/22 2303  Psych Admission Type (Psych Patients Only)  Admission Status Voluntary  Psychosocial Assessment  Patient Complaints Sleep disturbance;Anxiety  Eye Contact Fair  Facial Expression Animated;Anxious  Affect Anxious  Speech Logical/coherent  Interaction Assertive  Motor Activity Fidgety  Appearance/Hygiene Unremarkable  Behavior Characteristics Cooperative;Fidgety  Mood Anxious;Pleasant  Thought Process  Coherency WDL  Content WDL  Delusions WDL  Perception WDL  Hallucination None reported or observed  Judgment Limited  Confusion WDL  Danger to Self  Current suicidal ideation? Denies  Danger to Others  Danger to Others None reported or observed   Pt rated her day a 10/10, denies anxiety and depression. Received tylenol as requested for left foot pain from surgery. Denies SI/HI or hallucinations (a) 15 min checks (r) safety maintained.
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
## 2022-10-17 NOTE — Progress Notes (Signed)
   10/17/22 1600  Psych Admission Type (Psych Patients Only)  Admission Status Voluntary  Psychosocial Assessment  Patient Complaints Sleep disturbance  Eye Contact Fair  Facial Expression Animated  Affect Anxious  Speech Logical/coherent  Interaction Assertive  Motor Activity Fidgety  Appearance/Hygiene Unremarkable  Behavior Characteristics Cooperative  Mood Anxious  Thought Process  Coherency WDL  Content WDL  Delusions None reported or observed  Perception WDL  Hallucination None reported or observed  Judgment Limited  Confusion None  Danger to Self  Current suicidal ideation? Denies  Agreement Not to Harm Self Yes  Description of Agreement verbal  Danger to Others  Danger to Others None reported or observed

## 2022-10-17 NOTE — Progress Notes (Signed)
Chaplain met with Brooke Robinson to provide grief support around the loss of her SO to suicide.  She shared how difficult it is to grieve herself while also supporting her children who are grieving their father. She has good support from family and friends and is open to doing therapy as well.  Chaplain also referred her to [facility] for grief support for her and [facility] for her kids as well as support groups around losing a loved one to suicide. Discussed how this would give her an hour per week to dedicate for herself and her own grief. She feels that this would be helpful for her and that her time this week focusing on her own mental wellbeing has been valuable.  [address], [place] Pager, [phone]

## 2022-10-17 NOTE — Plan of Care (Signed)
  Problem: Education: Goal: Knowledge of [facility] Education information/materials will improve 10/17/2022 2000 by Jearl Klinefelter, RN Outcome: Progressing 10/17/2022 1959 by Jearl Klinefelter, RN Outcome: Progressing Goal: Emotional status will improve 10/17/2022 2000 by Jearl Klinefelter, RN Outcome: Progressing 10/17/2022 1959 by Jearl Klinefelter, RN Outcome: Progressing Goal: Mental status will improve Outcome: Progressing   Problem: Activity: Goal: Sleeping patterns will improve Outcome: Progressing

## 2022-10-18 DIAGNOSIS — F333 Major depressive disorder, recurrent, severe with psychotic symptoms: Secondary | ICD-10-CM | POA: Diagnosis not present

## 2022-10-18 MED ORDER — ONDANSETRON 4 MG PO TBDP
4.0000 mg | ORAL_TABLET | Freq: Two times a day (BID) | ORAL | Status: DC
  Administered 2022-10-18 (×2): 4 mg via ORAL
  Filled 2022-10-18 (×5): qty 1

## 2022-10-18 NOTE — [facility] Group Notes (Signed)
Adult Psychoeducational Group Note  Date:  10/18/2022 Time:  9:50 PM  Group Topic/Focus:  Wrap-Up Group:   The focus of this group is to help patients review their daily goal of treatment and discuss progress on daily workbooks.  Participation Level:  Active  Participation Quality:  Appropriate  Affect:  Appropriate  Cognitive:  Appropriate  Insight: Appropriate  Engagement in Group:  Engaged  Modes of Intervention:  Discussion  Additional Comments:  Pt attended the evening AA group.  Brooke Robinson 10/18/2022, 9:50 PM

## 2022-10-18 NOTE — Progress Notes (Signed)
Patient appears anxious. Patient denies SI/HI/AVH. Pt reports anxiety is 3/10 and depression is 1/10. Pt reports fair sleep and fair appetite. Patient complied with morning medication with no reported side effects. Patient remains safe on Q60min checks and contracts for safety.      10/18/22 1039  Psych Admission Type (Psych Patients Only)  Admission Status Voluntary  Psychosocial Assessment  Patient Complaints Sleep disturbance;Appetite decrease;Anxiety  Eye Contact Fair  Facial Expression Animated;Anxious  Affect Appropriate to circumstance  Speech Logical/coherent  Interaction Assertive  Motor Activity Fidgety;Restless  Appearance/Hygiene Unremarkable  Behavior Characteristics Cooperative  Mood Anxious  Thought Process  Coherency WDL  Content WDL  Delusions None reported or observed  Perception WDL  Hallucination None reported or observed  Judgment Limited  Confusion None  Danger to Self  Current suicidal ideation? Denies  Agreement Not to Harm Self Yes  Description of Agreement verbal  Danger to Others  Danger to Others None reported or observed

## 2022-10-18 NOTE — Plan of Care (Signed)
  Problem: Education: Goal: Knowledge of [facility] Education information/materials will improve Outcome: Progressing Goal: Emotional status will improve Outcome: Progressing Goal: Mental status will improve Outcome: Progressing Goal: Verbalization of understanding the information provided will improve Outcome: Progressing   Problem: Activity: Goal: Interest or engagement in activities will improve Outcome: Progressing Goal: Sleeping patterns will improve Outcome: Progressing   Problem: Coping: Goal: Ability to verbalize frustrations and anger appropriately will improve Outcome: Progressing Goal: Ability to demonstrate self-control will improve Outcome: Progressing   Problem: Health Behavior/Discharge Planning: Goal: Identification of resources available to assist in meeting health care needs will improve Outcome: Progressing Goal: Compliance with treatment plan for underlying cause of condition will improve Outcome: Progressing   Problem: Physical Regulation: Goal: Ability to maintain clinical measurements within normal limits will improve Outcome: Progressing   Problem: Safety: Goal: Periods of time without injury will increase Outcome: Progressing   Problem: Education: Goal: Utilization of techniques to improve thought processes will improve Outcome: Progressing Goal: Knowledge of the prescribed therapeutic regimen will improve Outcome: Progressing   Problem: Activity: Goal: Interest or engagement in leisure activities will improve Outcome: Progressing Goal: Imbalance in normal sleep/wake cycle will improve Outcome: Progressing   Problem: Coping: Goal: Coping ability will improve Outcome: Progressing Goal: Will verbalize feelings Outcome: Progressing   Problem: Health Behavior/Discharge Planning: Goal: Ability to make decisions will improve Outcome: Progressing Goal: Compliance with therapeutic regimen will improve Outcome: Progressing    Problem: Role Relationship: Goal: Will demonstrate positive changes in social behaviors and relationships Outcome: Progressing   Problem: Safety: Goal: Ability to disclose and discuss suicidal ideas will improve Outcome: Progressing Goal: Ability to identify and utilize support systems that promote safety will improve Outcome: Progressing   Problem: Self-Concept: Goal: Will verbalize positive feelings about self Outcome: Progressing Goal: Level of anxiety will decrease Outcome: Progressing   Problem: Education: Goal: Ability to make informed decisions regarding treatment will improve Outcome: Progressing   Problem: Coping: Goal: Coping ability will improve Outcome: Progressing   Problem: Health Behavior/Discharge Planning: Goal: Identification of resources available to assist in meeting health care needs will improve Outcome: Progressing   Problem: Medication: Goal: Compliance with prescribed medication regimen will improve Outcome: Progressing   Problem: Self-Concept: Goal: Ability to disclose and discuss suicidal ideas will improve Outcome: Progressing Goal: Will verbalize positive feelings about self Outcome: Progressing   Problem: Education: Goal: Ability to state activities that reduce stress will improve Outcome: Progressing   Problem: Coping: Goal: Ability to identify and develop effective coping behavior will improve Outcome: Progressing   Problem: Self-Concept: Goal: Ability to identify factors that promote anxiety will improve Outcome: Progressing Goal: Level of anxiety will decrease Outcome: Progressing Goal: Ability to modify response to factors that promote anxiety will improve Outcome: Progressing   

## 2022-10-18 NOTE — Progress Notes (Signed)
Patient received alert and oriented. Oriented to staff  and milieu. Denies SI/HI/AVH, anxiety 2/10 and depression 4/10.   Patient states she is depressed because she misses her two children.    10/18/22 1945  Psych Admission Type (Psych Patients Only)  Admission Status Voluntary  Psychosocial Assessment  Patient Complaints Sleep disturbance;Anxiety  Eye Contact Fair  Facial Expression Animated;Anxious  Affect Appropriate to circumstance  Speech Logical/coherent  Interaction Assertive  Motor Activity Fidgety;Restless  Appearance/Hygiene Unremarkable  Behavior Characteristics Cooperative  Mood Anxious;Pleasant  Thought Process  Coherency WDL  Content WDL  Delusions None reported or observed  Perception WDL  Hallucination None reported or observed  Judgment Limited  Confusion None  Danger to Self  Current suicidal ideation? Denies  Agreement Not to Harm Self Yes  Description of Agreement verbal  Danger to Others  Danger to Others None reported or observed   Denies pain. Encouraged to drink fluids and participate in group. Patient encouraged to come to staff with needs and problems.

## 2022-10-18 NOTE — Group Note (Signed)
Date:  10/18/2022 Time:  2:27 PM  Group Topic/Focus:  Goals Group:   The focus of this group is to help patients establish daily goals to achieve during treatment and discuss how the patient can incorporate goal setting into their daily lives to aide in recovery.    Participation Level:  Active  Participation Quality:  Appropriate  Affect:  Appropriate  Cognitive:  Appropriate  Insight: Appropriate  Engagement in Group:  Engaged  Modes of Intervention:  Discussion  Additional Comments:     Reymundo Poll 10/18/2022, 2:27 PM
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
## 2022-10-18 NOTE — Progress Notes (Signed)
Pt complains of foot swelling and 8/10 from surgical incision.

## 2022-10-19 DIAGNOSIS — F333 Major depressive disorder, recurrent, severe with psychotic symptoms: Secondary | ICD-10-CM | POA: Diagnosis not present

## 2022-10-19 MED ORDER — TRIPLE ANTIBIOTIC 3.5-400-5000 EX OINT: 1.0000 | TOPICAL_OINTMENT | Freq: Two times a day (BID) | CUTANEOUS | 0 refills | Status: DC

## 2022-10-19 MED ORDER — FLUOXETINE HCL 20 MG PO CAPS: 20.0000 mg | ORAL_CAPSULE | Freq: Every day | ORAL | 0 refills | Status: AC

## 2022-10-19 MED ORDER — ONDANSETRON 4 MG PO TBDP: 4.0000 mg | ORAL_TABLET | Freq: Two times a day (BID) | ORAL | 0 refills | Status: DC

## 2022-10-19 MED ORDER — NITROFURANTOIN MONOHYD MACRO 100 MG PO CAPS: 100.0000 mg | ORAL_CAPSULE | Freq: Two times a day (BID) | ORAL | 0 refills | Status: DC

## 2022-10-19 MED ORDER — WHITE PETROLATUM EX OINT
TOPICAL_OINTMENT | CUTANEOUS | Status: AC
  Filled 2022-10-19: qty 5

## 2022-10-19 MED ORDER — TRAZODONE HCL 50 MG PO TABS: 50.0000 mg | ORAL_TABLET | Freq: Every evening | ORAL | 0 refills | Status: AC | PRN

## 2022-10-19 NOTE — [facility] Suicide Risk Assessment (Signed)
[facility] INPATIENT:  Family/Significant Other Suicide Prevention Education  Suicide Prevention Education:  Education Completed; Francisca December,  (name of family member/significant other) has been identified by the patient as the family member/significant other with whom the patient will be residing, and identified as the person(s) who will aid the patient in the event of a mental health crisis (suicidal ideations/suicide attempt).  With written consent from the patient, the family member/significant other has been provided the following suicide prevention education, prior to the and/or following the discharge of the patient.    The suicide prevention education provided includes the following: Suicide risk factors Suicide prevention and interventions National Suicide Hotline telephone number [facility] assessment telephone number [facility] Emergency Assistance 911 [facility] and/or Residential Mobile Crisis Unit telephone number  Request made of family/significant other to: Remove weapons (e.g., guns, rifles, knives), all items previously/currently identified as safety concern.   Remove drugs/medications (over-the-counter, prescriptions, illicit drugs), all items previously/currently identified as a safety concern.  The family member/significant other verbalizes understanding of the suicide prevention education information provided.  The family member/significant other agrees to remove the items of safety concern listed above.  Michelyn Scullin O Antjuan Rothe 10/19/2022, 9:47 AM

## 2022-10-19 NOTE — [facility] Suicide Risk Assessment (Addendum)
Suicide Risk Assessment  Discharge Assessment    [facility] Discharge Suicide Risk Assessment   Principal Problem: MDD (major depressive disorder), recurrent, severe, with psychosis (HCC) Discharge Diagnoses: Principal Problem:   MDD (major depressive disorder), recurrent, severe, with psychosis (HCC) Active Problems:   Cannabis use disorder  Reason for admission: As per admissions assessment, "22 year old AA female with hx of self-mutilating behaviors, previous suicide attempts & cannabis use disorder. Admitted to the [facility] from the [facility] with complaint of worsening suicidal ideations with plan to drive her car off a bridge. Chart review reports indicated that patient is also hearing voices telling her to hurt herself & she was also seeing shadows."   Hospital Course: During the patient's hospitalization, patient had extensive initial psychiatric evaluation, and follow-up psychiatric evaluations every day. Psychiatric diagnoses provided upon initial assessment as as listed above. Patient's psychiatric medications were adjusted on admission on admission as follows: -Continued Fluoxetine 20 mg po daily for depression.  -Continued Hydroxyzine 25 mg po tid prn for anxiety,  -Continued Trazodone 50 mg po q hs prn for insomnia. -Continued Macobid  100 mg po bid for uti x 9 days (as per order)  During the hospitalization, other adjustments were made to the patient's psychiatric medication regimen. Medications at discharge are as follows: -Continue Fluoxetine 20 mg po daily for depression.  -Continue Trazodone 50 mg po q hs prn for insomnia. -Continue Bacitracin ointment to L foot surgical site for infection prophylaxis -Continue Macobid  100 mg po bid for uti x 3 doses, then stop, and f/u with PCP. Pt also educated to call foot surgeon regarding surgical site on L foot still oozing fluids.  Patient's care was discussed during the interdisciplinary team meeting every day during the hospitalization. The  patient denies having side effects to prescribed psychiatric medication. Gradually, patient started adjusting to milieu. The patient was evaluated each day by a clinical provider to ascertain response to treatment. Improvement was noted by the patient's report of decreasing symptoms, improved sleep and appetite, affect, medication tolerance, behavior, and participation in unit programming.  Patient was asked each day to complete a self inventory noting mood, mental status, pain, new symptoms, anxiety and concerns.    Symptoms were reported as significantly decreased or resolved completely by discharge.  On day of discharge, the patient reports that their mood is stable. The patient denied having suicidal thoughts for more than 48 hours prior to discharge.  Patient denies having homicidal thoughts.  Patient denies having auditory hallucinations.  Patient denies any visual hallucinations or other symptoms of psychosis. The patient was motivated to continue taking medication with a goal of continued improvement in mental health.   The patient reports their target psychiatric symptoms of depression, anxiety & insomnia responded well to the psychiatric medications, and the patient reports overall benefit from this psychiatric hospitalization. Supportive psychotherapy was provided to the patient. The patient also participated in regular group therapy while hospitalized. Coping skills, problem solving as well as relaxation therapies were also part of the unit programming.  Labs were reviewed with the patient, and abnormal results were discussed with the patient.  The patient is able to verbalize their individual safety plan to this provider.  # It is recommended to the patient to continue psychiatric medications as prescribed, after discharge from the hospital.    # It is recommended to the patient to follow up with your outpatient psychiatric provider and PCP.  # It was discussed with the patient, the impact  of alcohol, drugs, tobacco have been there overall psychiatric and medical wellbeing, and total abstinence from substance use was recommended the patient.ed.  # Prescriptions provided or sent directly to preferred pharmacy at discharge. Patient agreeable to plan. Given opportunity to ask questions. Appears to feel comfortable with discharge.    # In the event of worsening symptoms, the patient is instructed to call the crisis hotline, 911 and or go to the nearest ED for appropriate evaluation and treatment of symptoms. To follow-up with primary care provider for other medical issues, concerns and or health care needs  # Patient was discharged home with a plan to follow up as noted below.   Total Time spent with patient: 45 minutes  Musculoskeletal: Strength & Muscle Tone: within normal limits Gait & Station: normal Patient leans: N/A  Psychiatric Specialty Exam  Presentation  General Appearance:  Appropriate for Environment; Casual; Fairly Groomed  Eye Contact: Fair  Speech: Clear and Coherent  Speech Volume: Normal  Handedness: Right   Mood and Affect  Mood: -- (Improving)  Duration of Depression Symptoms: Greater than two weeks  Affect: Appropriate   Thought Process  Thought Processes: Coherent  Descriptions of Associations:Intact  Orientation:Full (Time, Place and Person)  Thought Content:Logical  History of Schizophrenia/Schizoaffective disorder:No  Duration of Psychotic Symptoms:No data recorded Hallucinations:Hallucinations: None Description of Auditory Hallucinations: NA Description of Visual Hallucinations: NA  Ideas of Reference:None  Suicidal Thoughts:Suicidal Thoughts: No SI Active Intent and/or Plan: Without Intent; Without Plan; Without Means to Carry Out; Without Access to Means  Homicidal Thoughts:Homicidal Thoughts: No   Sensorium  Memory: Immediate Good  Judgment: Good  Insight: Good   Executive Functions   Concentration: Good  Attention Span: Good  Recall: Good  Fund of Knowledge: Good  Language: Good   Psychomotor Activity  Psychomotor Activity: Psychomotor Activity: Normal   Assets  Assets: Desire for Improvement   Sleep  Sleep: Sleep: Good Number of Hours of Sleep: 7.5   Physical Exam: Physical Exam Constitutional:      Appearance: Normal appearance.  Eyes:     Pupils: Pupils are equal, round, and reactive to light.  Musculoskeletal:        General: Normal range of motion.     Cervical back: Normal range of motion.  Neurological:     General: No focal deficit present.     Mental Status: She is alert and oriented to person, place, and time.  Psychiatric:        Behavior: Behavior normal.    Review of Systems  Constitutional:  Negative for fever.  HENT:  Negative for hearing loss.   Eyes:  Negative for blurred vision.  Respiratory:  Negative for cough.   Cardiovascular:  Negative for chest pain.  Gastrointestinal:  Negative for heartburn.  Genitourinary:  Negative for dysuria.  Musculoskeletal:  Negative for myalgias.  Skin:  Negative for rash.  Neurological:  Negative for dizziness.  Psychiatric/Behavioral:  Positive for depression (Denies SI/HI/AVH, denies plan or intent to harm self after discharge). Negative for hallucinations, memory loss and suicidal ideas. The patient is nervous/anxious (Resolving) and has insomnia (Resolving on current meds).    Blood pressure 134/75, pulse 92, temperature 98.2 F (36.8 C), temperature source Oral, resp. rate 16, height 5\' 5"  (1.651 m), weight 80.7 kg, SpO2 100%. Body mass index is 29.62 kg/m.  Mental Status Per Nursing Assessment::   On Admission:  Suicidal ideation indicated by patient  Demographic Factors:  Adolescent or young adult  Loss Factors: Financial  problems/change in socioeconomic status  Historical Factors: Impulsivity  Risk Reduction Factors:   Sense of responsibility to  family  Continued Clinical Symptoms:  Reports that depressive symptoms have significantly improved. Denies SI/HI, denies intent or plan to harm self or others and verbally contracts for safety outside of [place].  Cognitive Features That Contribute To Risk:  None    Suicide Risk:  Mild:  There are no identifiable suicide plans, no associated intent, mild dysphoria and related symptoms, good self-control (both objective and subjective assessment), few other risk factors, and identifiable protective factors, including available and accessible social support.    Follow-up Information     [facility] Follow up on 10/24/2022.   Why: You have a hospital follow up appointment for therapy and medication management services on 10/24/22 at 11:00 am.  This will be a Virtual telehealth appointment. Contact information: [address]  [address] [place] [address] [phone]               Starleen Blue, NP 10/19/2022, 10:26 AM

## 2022-10-19 NOTE — Progress Notes (Signed)
  [facility] Adult Case Management Discharge Plan :  Will you be returning to the same living situation after discharge:yes   At discharge, do you have transportation home?: Yes,  Brother will be picking up patient for discharge to home. Do you have the ability to pay for your medications: Yes,     Release of information consent forms completed and in the chart;  Patient's signature needed at discharge.  Patient to Follow up at:  Follow-up Information     [facility] Follow up on 10/24/2022.   Why: You have a hospital follow up appointment for therapy and medication management services on 10/24/22 at 11:00 am.  This will be a Virtual telehealth appointment. Contact information: [address]  [address] [place] [address] [phone]                 Next level of care provider has access to [facility] Link:no  Safety Planning and Suicide Prevention discussed: Yes,  Completed with patient's mother.     Has patient been referred to the Quitline?: Patient does not use tobacco/nicotine products  Patient has been referred for addiction treatment: No known substance use disorder.  Arriah Wadle O Jenea Dake, LCSW 10/19/2022, 9:50 AM

## 2022-10-19 NOTE — Progress Notes (Signed)
Discharge Note:   AVS reviewed with Pt and family. Pt denies SI/HI/AVH. Belongings returned. Suicide safety plan completed and copy given. Survey done. Pt escorted to lobby.

## 2022-10-19 NOTE — Discharge Summary (Signed)
Physician Discharge Summary Note  Patient:  Brooke Robinson is an 22 y.o., female MRN:  [id number] DOB:  [birth year] Patient phone:  [phone] (home)  Patient address:   [address] [place] [address],  Total Time spent with patient: 45 minutes  Date of Admission:  10/15/2022 Date of Discharge: 10/19/2022  Reason for Admission:  Reason for admission: As per admissions assessment, "22 year old AA female with hx of self-mutilating behaviors, previous suicide attempts & cannabis use disorder. Admitted to the [facility] from the [facility] with complaint of worsening suicidal ideations with plan to drive her car off a bridge. Chart review reports indicated that patient is also hearing voices telling her to hurt herself & she was also seeing shadows."   Principal Problem: MDD (major depressive disorder), recurrent, severe, with psychosis (HCC) Discharge Diagnoses: Principal Problem:   MDD (major depressive disorder), recurrent, severe, with psychosis (HCC) Active Problems:   Cannabis use disorder  Past Psychiatric History: See H & P  Past Medical History:  Past Medical History:  Diagnosis Date   Herpes    Hypertension    Seasonal allergies     Past Surgical History:  Procedure Laterality Date   TONSILLECTOMY     Family History:  Family History  Problem Relation Age of Onset   Healthy Mother    Family Psychiatric  History: See H & P Social History:  Social History   Substance and Sexual Activity  Alcohol Use Never     Social History   Substance and Sexual Activity  Drug Use Never    Social History   Socioeconomic History   Marital status: Significant Other    Spouse name: Not on file   Number of children: Not on file   Years of education: Not on file   Highest education level: Not on file  Occupational History   Not on file  Tobacco Use   Smoking status: Never   Smokeless tobacco: Never  Vaping Use   Vaping status: Never Used  Substance and Sexual Activity    Alcohol use: Never   Drug use: Never   Sexual activity: Not Currently  Other Topics Concern   Not on file  Social History Narrative   Not on file   Social Determinants of Health   Financial Resource Strain: Medium Risk (03/21/2022)   Received from [facility], [facility]   Overall Financial Resource Strain (CARDIA)    Difficulty of Paying Living Expenses: Somewhat hard  Food Insecurity: No Food Insecurity (10/15/2022)   Hunger Vital Sign    Worried About Running Out of Food in the Last Year: Never true    Ran Out of Food in the Last Year: Never true  Transportation Needs: No Transportation Needs (10/15/2022)   PRAPARE - Administrator, Civil Service (Medical): No    Lack of Transportation (Non-Medical): No  Physical Activity: Sufficiently Active (03/21/2022)   Received from [facility], [facility]   Exercise Vital Sign    Days of Exercise per Week: 7 days    Minutes of Exercise per Session: 150+ min  Stress: Stress Concern Present (09/12/2022)   Received from [facility] - Occupational Stress Questionnaire    Feeling of Stress : To some extent  Social Connections: Somewhat Isolated (03/21/2022)   Received from [facility], [facility]   Social Network    How would you rate your social network (family, work, friends)?: Restricted participation with some  degree of social isolation   Hospital Course:   During the patient's hospitalization, patient had extensive initial psychiatric evaluation, and follow-up psychiatric evaluations every day. Psychiatric diagnoses provided upon initial assessment as as listed above. Patient's psychiatric medications were adjusted on admission on admission as follows: -Continued Fluoxetine 20 mg po daily for depression.  -Continued Hydroxyzine 25 mg po tid prn for anxiety,  -Continued Trazodone 50 mg po q hs prn for insomnia. -Continued Macobid  100 mg po bid for uti x 9 days (as  per order)   During the hospitalization, other adjustments were made to the patient's psychiatric medication regimen. Medications at discharge are as follows: -Continue Fluoxetine 20 mg po daily for depression.  -Continue Trazodone 50 mg po q hs prn for insomnia. -Continue Bacitracin ointment to L foot surgical site for infection prophylaxis -Continue Macobid  100 mg po bid for uti x 3 doses, then stop, and f/u with PCP. Pt also educated to call foot surgeon regarding surgical site on L foot still oozing fluids.   Patient's care was discussed during the interdisciplinary team meeting every day during the hospitalization. The patient denies having side effects to prescribed psychiatric medication. Gradually, patient started adjusting to milieu. The patient was evaluated each day by a clinical provider to ascertain response to treatment. Improvement was noted by the patient's report of decreasing symptoms, improved sleep and appetite, affect, medication tolerance, behavior, and participation in unit programming.  Patient was asked each day to complete a self inventory noting mood, mental status, pain, new symptoms, anxiety and concerns.     Symptoms were reported as significantly decreased or resolved completely by discharge.  On day of discharge, the patient reports that their mood is stable. The patient denied having suicidal thoughts for more than 48 hours prior to discharge.  Patient denies having homicidal thoughts.  Patient denies having auditory hallucinations.  Patient denies any visual hallucinations or other symptoms of psychosis. The patient was motivated to continue taking medication with a goal of continued improvement in mental health.    The patient reports their target psychiatric symptoms of depression, anxiety & insomnia responded well to the psychiatric medications, and the patient reports overall benefit from this psychiatric hospitalization. Supportive psychotherapy was provided to the  patient. The patient also participated in regular group therapy while hospitalized. Coping skills, problem solving as well as relaxation therapies were also part of the unit programming.   Labs were reviewed with the patient, and abnormal results were discussed with the patient.   The patient is able to verbalize their individual safety plan to this provider.   # It is recommended to the patient to continue psychiatric medications as prescribed, after discharge from the hospital.     # It is recommended to the patient to follow up with your outpatient psychiatric provider and PCP.   # It was discussed with the patient, the impact of alcohol, drugs, tobacco have been there overall psychiatric and medical wellbeing, and total abstinence from substance use was recommended the patient.ed.   # Prescriptions provided or sent directly to preferred pharmacy at discharge. Patient agreeable to plan. Given opportunity to ask questions. Appears to feel comfortable with discharge.    # In the event of worsening symptoms, the patient is instructed to call the crisis hotline, 911 and or go to the nearest ED for appropriate evaluation and treatment of symptoms. To follow-up with primary care provider for other medical issues, concerns and or health care needs   #  Patient was discharged home with a plan to follow up as noted below.    Total Time spent with patient: 45 minutes   Physical Findings: AIMS: Facial and Oral Movements Muscles of Facial Expression: None, normal Lips and Perioral Area: None, normal Jaw: None, normal Tongue: None, normal,Extremity Movements Upper (arms, wrists, hands, fingers): None, normal Lower (legs, knees, ankles, toes): None, normal, Trunk Movements Neck, shoulders, hips: None, normal, Overall Severity Severity of abnormal movements (highest score from questions above): None, normal Incapacitation due to abnormal movements: None, normal Patient's awareness of abnormal  movements (rate only patient's report): No Awareness, Dental Status Current problems with teeth and/or dentures?: No Does patient usually wear dentures?: No  CIWA:  0 COWS:  0 AIMS: 0 Musculoskeletal: Strength & Muscle Tone: within normal limits Gait & Station: normal Patient leans: N/A   Psychiatric Specialty Exam:  Presentation  General Appearance:  Appropriate for Environment; Casual; Fairly Groomed  Eye Contact: Fair  Speech: Clear and Coherent  Speech Volume: Normal  Handedness: Right   Mood and Affect  Mood: -- (Improving)  Affect: Appropriate  Thought Process  Thought Processes: Coherent  Descriptions of Associations:Intact  Orientation:Full (Time, Place and Person)  Thought Content:Logical  History of Schizophrenia/Schizoaffective disorder:No  Duration of Psychotic Symptoms:No data recorded Hallucinations:Hallucinations: None Description of Auditory Hallucinations: NA Description of Visual Hallucinations: NA  Ideas of Reference:None  Suicidal Thoughts:Suicidal Thoughts: No SI Active Intent and/or Plan: Without Intent; Without Plan; Without Means to Carry Out; Without Access to Means  Homicidal Thoughts:Homicidal Thoughts: No  Sensorium  Memory: Immediate Good  Judgment: Good  Insight: Good  Executive Functions  Concentration: Good  Attention Span: Good  Recall: Good  Fund of Knowledge: Good  Language: Good  Psychomotor Activity  Psychomotor Activity: Psychomotor Activity: Normal  Assets  Assets: Desire for Improvement  Sleep  Sleep: Sleep: Good Number of Hours of Sleep: 7.5  Physical Exam: Physical Exam Review of Systems  Psychiatric/Behavioral:  Positive for depression (Denies SI/HI, denies intent or plan). Negative for hallucinations, memory loss and suicidal ideas. The patient has insomnia (Resolving on current meds). The patient is not nervous/anxious.    Blood pressure 134/75, pulse 92, temperature  98.2 F (36.8 C), temperature source Oral, resp. rate 16, height 5\' 5"  (1.651 m), weight 80.7 kg, SpO2 100%. Body mass index is 29.62 kg/m.   Social History   Tobacco Use  Smoking Status Never  Smokeless Tobacco Never   Tobacco Cessation:  N/A, patient does not currently use tobacco products  Blood Alcohol level:  Lab Results  Component Value Date   ETH <10 10/14/2022   Metabolic Disorder Labs:  Lab Results  Component Value Date   HGBA1C 5.3 10/14/2022   MPG 105 10/14/2022   Lab Results  Component Value Date   PROLACTIN 79.7 (H) 10/14/2022   Lab Results  Component Value Date   CHOL 216 (H) 10/14/2022   TRIG 81 10/14/2022   HDL 52 10/14/2022   CHOLHDL 4.2 10/14/2022   VLDL 16 10/14/2022   LDLCALC 148 (H) 10/14/2022    See Psychiatric Specialty Exam and Suicide Risk Assessment completed by Attending Physician prior to discharge.  Discharge destination:  Home  Is patient on multiple antipsychotic therapies at discharge:  No   Has Patient had three or more failed trials of antipsychotic monotherapy by history:  No  Recommended Plan for Multiple Antipsychotic Therapies: NA   Allergies as of 10/19/2022   No Known Allergies      Medication List  STOP taking these medications    CLEAR EYES OP   cyclobenzaprine 10 MG tablet Commonly known as: FLEXERIL   hydrOXYzine 25 MG tablet Commonly known as: ATARAX   oxyCODONE-acetaminophen 5-325 MG tablet Commonly known as: PERCOCET/ROXICET   PROBIOTIC DAILY PO   triamcinolone 55 MCG/ACT Aero nasal inhaler Commonly known as: NASACORT   valACYclovir 1000 MG tablet Commonly known as: VALTREX       TAKE these medications      Indication  albuterol 108 (90 Base) MCG/ACT inhaler Commonly known as: VENTOLIN HFA Inhale 2 puffs into the lungs every 6 (six) hours as needed for wheezing or shortness of breath.  Indication: Spasm of Lung Air Passages   FLUoxetine 20 MG capsule Commonly known as:  PROZAC Take 1 capsule (20 mg total) by mouth daily. Start taking on: October 20, 2022  Indication: Generalized Anxiety Disorder, Major Depressive Disorder   neomycin-bacitracin-polymyxin [id number] Oint Apply 1 Application topically 2 (two) times daily. Apply to Left great toe  Indication: infection prophylaxis   nitrofurantoin (macrocrystal-monohydrate) 100 MG capsule Commonly known as: MACROBID Take 1 capsule (100 mg total) by mouth every 12 (twelve) hours.  Indication: Simple Infection of the Urinary Tract   ondansetron 4 MG disintegrating tablet Commonly known as: ZOFRAN-ODT Take 1 tablet (4 mg total) by mouth every 12 (twelve) hours.  Indication: Nausea and Vomiting   traZODone 50 MG tablet Commonly known as: DESYREL Take 1 tablet (50 mg total) by mouth at bedtime as needed for sleep.  Indication: Trouble Sleeping        Follow-up Information     [facility] Follow up on 10/24/2022.   Why: You have a hospital follow up appointment for therapy and medication management services on 10/24/22 at 11:00 am.  This will be a Virtual telehealth appointment. Contact information: [address]  [address] [place] [address] [phone]                Signed: Starleen Blue, NP 10/19/2022, 2:46 PM

## 2023-04-24 ENCOUNTER — Encounter (HOSPITAL_COMMUNITY): Payer: Self-pay | Admitting: Emergency Medicine

## 2023-04-24 ENCOUNTER — Other Ambulatory Visit: Payer: Self-pay

## 2023-04-24 ENCOUNTER — Emergency Department (HOSPITAL_COMMUNITY)
Admission: EM | Admit: 2023-04-24 | Discharge: 2023-04-25 | Disposition: A | Discharge status: Home / Self Care | Payer: MEDICAID | Attending: Emergency Medicine | Admitting: Emergency Medicine

## 2023-04-24 DIAGNOSIS — R1031 Right lower quadrant pain: Secondary | ICD-10-CM | POA: Insufficient documentation

## 2023-04-24 DIAGNOSIS — R1030 Lower abdominal pain, unspecified: Secondary | ICD-10-CM

## 2023-04-24 DIAGNOSIS — R300 Dysuria: Secondary | ICD-10-CM | POA: Diagnosis not present

## 2023-04-24 DIAGNOSIS — I10 Essential (primary) hypertension: Secondary | ICD-10-CM | POA: Diagnosis not present

## 2023-04-24 MED ORDER — SODIUM CHLORIDE 0.9 % IV BOLUS
500.0000 mL | Freq: Once | INTRAVENOUS | Status: AC
  Administered 2023-04-25: 500 mL via INTRAVENOUS

## 2023-04-24 NOTE — ED Provider Notes (Signed)
Emergency Department Provider Note   I have reviewed the triage vital signs and the nursing notes.   HISTORY  Chief Complaint Abdominal Pain   HPI Chrysa Rampy is a 23 y.o. female with past history reviewed below including hypertension presents to the emergency department with right lower quadrant abdominal pain.  Symptoms began this evening.  She has had this pain once before couple of years prior and reports this was due to UTI.  She is having some mild dysuria but no hesitancy or urgency.  No fevers or chills.  No prior abdominal surgeries.  No pain into the chest or shortness of breath.   Past Medical History:  Diagnosis Date   Herpes    Hypertension    Seasonal allergies     Review of Systems  Constitutional: No fever/chills Cardiovascular: Denies chest pain. Respiratory: Denies shortness of breath. Gastrointestinal: Positive abdominal pain. No nausea, no vomiting.   Genitourinary: Negative for dysuria. Neurological: Negative for headaches. ____________________________________________   PHYSICAL EXAM:  VITAL SIGNS: ED Triage Vitals  Encounter Vitals Group     BP 04/24/23 2321 129/86     Pulse Rate 04/24/23 2321 83     Resp 04/24/23 2321 18     Temp 04/24/23 2321 98.3 F (36.8 C)     Temp src --      SpO2 04/24/23 2321 97 %     Weight 04/24/23 2323 160 lb (72.6 kg)     Height 04/24/23 2323 5\' 5"  (1.651 m)   Constitutional: Alert and oriented. Well appearing and in no acute distress. Eyes: Conjunctivae are normal. Head: Atraumatic. Nose: No congestion/rhinnorhea. Mouth/Throat: Mucous membranes are moist. Neck: No stridor.   Cardiovascular: Normal rate, regular rhythm. Good peripheral circulation. Grossly normal heart sounds.   Respiratory: Normal respiratory effort.  No retractions. Lungs CTAB. Gastrointestinal: Soft with tenderness in the right abdomen, more focal in the RLQ. No distention.  GU: Pelvic exam performed with nurse chaperone at bedside  and after obtaining patient's verbal consent.  Normal external genitalia.  Mild tenderness on insertion of the speculum with moderate discharge, creamy white in appearance.  Musculoskeletal: No gross deformities of extremities. Neurologic:  Normal speech and language.  Skin:  Skin is warm, dry and intact. No rash noted  ____________________________________________   LABS (all labs ordered are listed, but only abnormal results are displayed)  Labs Reviewed  WET PREP, GENITAL - Abnormal; Notable for the following components:      Result Value   Clue Cells Wet Prep HPF POC PRESENT (*)    All other components within normal limits  COMPREHENSIVE METABOLIC PANEL - Abnormal; Notable for the following components:   AST 12 (*)    Alkaline Phosphatase 36 (*)    All other components within normal limits  CBC - Abnormal; Notable for the following components:   HCT 35.3 (*)    All other components within normal limits  URINALYSIS, ROUTINE W REFLEX MICROSCOPIC - Abnormal; Notable for the following components:   Color, Urine STRAW (*)    All other components within normal limits  LIPASE, BLOOD  HCG, SERUM, QUALITATIVE  GC/CHLAMYDIA PROBE AMP ([place]) NOT AT [facility]   ____________________________________________  RADIOLOGY  CT ABDOMEN PELVIS W CONTRAST Result Date: 04/25/2023 CLINICAL DATA:  Right lower quadrant abdominal pain, vomiting EXAM: CT ABDOMEN AND PELVIS WITH CONTRAST TECHNIQUE: Multidetector CT imaging of the abdomen and pelvis was performed using the standard protocol following bolus administration of intravenous contrast. RADIATION DOSE REDUCTION: This exam  was performed according to the departmental dose-optimization program which includes automated exposure control, adjustment of the mA and/or kV according to patient size and/or use of iterative reconstruction technique. CONTRAST:  75mL OMNIPAQUE IOHEXOL 350 MG/ML SOLN COMPARISON:  None Available. FINDINGS: Lower chest: Lung bases  are clear. Hepatobiliary: Liver is within normal limits. Gallbladder is unremarkable. No intrahepatic or extrahepatic ductal dilatation. Pancreas: Within normal limits. Spleen: Within normal limits. Adrenals/Urinary Tract: Adrenal glands are within normal limits. Kidneys are within normal limits.  No hydronephrosis. Bladder is within normal limits. Stomach/Bowel: Stomach is within normal limits. No evidence of bowel obstruction. Normal appendix (series 3/image 59). No colonic wall thickening or inflammatory changes. Vascular/Lymphatic: No evidence of abdominal aortic aneurysm. No suspicious abdominopelvic lymphadenopathy. Reproductive: Uterus is within normal limits. Bilateral ovaries are within normal limits. However, there is mild anterior pelvic mesenteric stranding (series 3/image 67), nonspecific but raising the possibility of PID. Other: No abdominopelvic ascites. Musculoskeletal: Visualized osseous structures are within normal limits. IMPRESSION: Normal appendix. Mild anterior pelvic mesenteric stranding, nonspecific but raising the possibility of PID. Electronically Signed   By: Charline Bills M.D.   On: 04/25/2023 00:56    ____________________________________________   PROCEDURES  Procedure(s) performed:   Procedures  None  ____________________________________________   INITIAL IMPRESSION / ASSESSMENT AND PLAN / ED COURSE  Pertinent labs & imaging results that were available during my care of the patient were reviewed by me and considered in my medical decision making (see chart for details).   This patient is Presenting for Evaluation of abdomen pain, which does require a range of treatment options, and is a complaint that involves a high risk of morbidity and mortality.  The Differential Diagnoses includes but is not exclusive to ectopic pregnancy, ovarian cyst, ovarian torsion, acute appendicitis, urinary tract infection, endometriosis, bowel obstruction, hernia, colitis, renal  colic, gastroenteritis, volvulus etc.   Critical Interventions-    Medications  cefTRIAXone (ROCEPHIN) 1 g in sodium chloride 0.9 % 100 mL IVPB (has no administration in time range)  metroNIDAZOLE (FLAGYL) tablet 500 mg (has no administration in time range)  doxycycline (VIBRA-TABS) tablet 100 mg (has no administration in time range)  sodium chloride 0.9 % bolus 500 mL (0 mLs Intravenous Stopped 04/25/23 0138)  iohexol (OMNIPAQUE) 350 MG/ML injection 75 mL (75 mLs Intravenous Contrast Given 04/25/23 0049)  ketorolac (TORADOL) 30 MG/ML injection 30 mg (30 mg Intravenous Given 04/25/23 0137)    Reassessment after intervention: pain improved.    Clinical Laboratory Tests Ordered, included UA without infection.  Wet prep shows clue cells.  Pregnancy negative.  CMP shows normal LFTs and bilirubin.  Normal lipase.  Radiologic Tests Ordered, included CT abdomen/pain. I independently interpreted the images and agree with radiology interpretation.   Cardiac Monitor Tracing which shows NSR.    Social Determinants of Health Risk patient is a non-smoker.   Medical Decision Making: Summary:  Patient presents emergency department valuation of right side abdominal pain.  She does have focal tenderness on exam.  Plan to move forward with CT abdomen pelvis with differential as above.  Reevaluation with update and discussion with patient.  CT shows normal appendix but some nonspecific mesenteric stranding in the pelvis.  PID is in the differential.  Moderate discharge on pelvic exam. Plan to treat for possible PID. Patient is established with a Gyn and will call for 2 week follow up.   Considered admission but workup is overall reassuring and patient appears stable for outpatient management.  Patient's presentation is  most consistent with acute presentation with potential threat to life or bodily function.   Disposition: discharge  ____________________________________________  FINAL CLINICAL  IMPRESSION(S) / ED DIAGNOSES  Final diagnoses:  Lower abdominal pain     NEW OUTPATIENT MEDICATIONS STARTED DURING THIS VISIT:  New Prescriptions   DOXYCYCLINE (VIBRAMYCIN) 100 MG CAPSULE    Take 1 capsule (100 mg total) by mouth 2 (two) times daily for 14 days.   IBUPROFEN (ADVIL) 800 MG TABLET    Take 1 tablet (800 mg total) by mouth every 8 (eight) hours as needed for moderate pain (pain score 4-6).   METRONIDAZOLE (FLAGYL) 500 MG TABLET    Take 1 tablet (500 mg total) by mouth 2 (two) times daily for 14 days.    Note:  This document was prepared using Dragon voice recognition software and may include unintentional dictation errors.  Alona Bene, MD, [facility]    Heyli Min, Arlyss Repress, MD 04/25/23 [phone]

## 2023-04-24 NOTE — ED Triage Notes (Signed)
Patient complaining of lower abdominal pain that started yesterday. Reports nausea without vomiting.

## 2023-04-25 ENCOUNTER — Emergency Department (HOSPITAL_COMMUNITY): Payer: MEDICAID

## 2023-04-25 LAB — CBC
HCT: 35.3 % — ABNORMAL LOW (ref 36.0–46.0)
Hemoglobin: 12 g/dL (ref 12.0–15.0)
MCH: 28.9 pg (ref 26.0–34.0)
MCHC: 34 g/dL (ref 30.0–36.0)
MCV: 85.1 fL (ref 80.0–100.0)
Platelets: 261 10*3/uL (ref 150–400)
RBC: 4.15 MIL/uL (ref 3.87–5.11)
RDW: 13.2 % (ref 11.5–15.5)
WBC: 9.4 10*3/uL (ref 4.0–10.5)
nRBC: 0 % (ref 0.0–0.2)

## 2023-04-25 LAB — URINALYSIS, ROUTINE W REFLEX MICROSCOPIC
Bilirubin Urine: NEGATIVE
Glucose, UA: NEGATIVE mg/dL
Hgb urine dipstick: NEGATIVE
Ketones, ur: NEGATIVE mg/dL
Leukocytes,Ua: NEGATIVE
Nitrite: NEGATIVE
Protein, ur: NEGATIVE mg/dL
Specific Gravity, Urine: 1.011 (ref 1.005–1.030)
pH: 6 (ref 5.0–8.0)

## 2023-04-25 LAB — COMPREHENSIVE METABOLIC PANEL
ALT: 10 U/L (ref 0–44)
AST: 12 U/L — ABNORMAL LOW (ref 15–41)
Albumin: 3.7 g/dL (ref 3.5–5.0)
Alkaline Phosphatase: 36 U/L — ABNORMAL LOW (ref 38–126)
Anion gap: 8 (ref 5–15)
BUN: 13 mg/dL (ref 6–20)
CO2: 23 mmol/L (ref 22–32)
Calcium: 9.2 mg/dL (ref 8.9–10.3)
Chloride: 106 mmol/L (ref 98–111)
Creatinine, Ser: 0.86 mg/dL (ref 0.44–1.00)
GFR, Estimated: >60 mL/min (ref >60)
Glucose, Bld: 95 mg/dL (ref 70–99)
Potassium: 4.3 mmol/L (ref 3.5–5.1)
Sodium: 137 mmol/L (ref 135–145)
Total Bilirubin: 0.6 mg/dL (ref 0.0–1.2)
Total Protein: 6.8 g/dL (ref 6.5–8.1)

## 2023-04-25 LAB — WET PREP, GENITAL
Sperm: NONE SEEN
Trich, Wet Prep: NONE SEEN
WBC, Wet Prep HPF POC: <10 (ref <10)
Yeast Wet Prep HPF POC: NONE SEEN

## 2023-04-25 LAB — GC/CHLAMYDIA PROBE AMP (~~LOC~~) NOT AT ARMC
Chlamydia: NEGATIVE
Comment: NEGATIVE
Comment: NORMAL
Neisseria Gonorrhea: NEGATIVE

## 2023-04-25 LAB — LIPASE, BLOOD: Lipase: 25 U/L (ref 11–51)

## 2023-04-25 LAB — HCG, SERUM, QUALITATIVE: Preg, Serum: NEGATIVE

## 2023-04-25 MED ORDER — IBUPROFEN 800 MG PO TABS: 800.0000 mg | ORAL_TABLET | Freq: Three times a day (TID) | ORAL | 0 refills | Status: AC | PRN

## 2023-04-25 MED ORDER — METRONIDAZOLE 500 MG PO TABS: 500.0000 mg | ORAL_TABLET | Freq: Two times a day (BID) | ORAL | 0 refills | Status: AC

## 2023-04-25 MED ORDER — METRONIDAZOLE 500 MG PO TABS
500.0000 mg | ORAL_TABLET | Freq: Once | ORAL | Status: AC
  Administered 2023-04-25: 500 mg via ORAL
  Filled 2023-04-25: qty 1

## 2023-04-25 MED ORDER — KETOROLAC TROMETHAMINE 30 MG/ML IJ SOLN
30.0000 mg | Freq: Once | INTRAMUSCULAR | Status: AC
  Administered 2023-04-25: 30 mg via INTRAVENOUS
  Filled 2023-04-25: qty 1

## 2023-04-25 MED ORDER — DOXYCYCLINE HYCLATE 100 MG PO CAPS: 100.0000 mg | ORAL_CAPSULE | Freq: Two times a day (BID) | ORAL | 0 refills | Status: AC

## 2023-04-25 MED ORDER — IOHEXOL 350 MG/ML SOLN
75.0000 mL | Freq: Once | INTRAVENOUS | Status: AC | PRN
Start: 2023-04-25 — End: 2023-04-25
  Administered 2023-04-25: 75 mL via INTRAVENOUS

## 2023-04-25 MED ORDER — SODIUM CHLORIDE 0.9 % IV SOLN
1.0000 g | Freq: Once | INTRAVENOUS | Status: AC
  Administered 2023-04-25: 1 g via INTRAVENOUS
  Filled 2023-04-25: qty 10

## 2023-04-25 MED ORDER — DOXYCYCLINE HYCLATE 100 MG PO TABS
100.0000 mg | ORAL_TABLET | Freq: Once | ORAL | Status: AC
  Administered 2023-04-25: 100 mg via ORAL
  Filled 2023-04-25: qty 1

## 2023-04-25 NOTE — Discharge Instructions (Addendum)
You were seen emerged part today with lower abdominal pain.  Your CT scan did not show any appendicitis but you do show some inflammation in your pelvis.  I am treating you with antibiotics for the next 2 weeks.  You may follow your test results on the MyChart app which will come back in the next couple of days.  I would like for you to have close follow-up with the GYN doctor, preferably in the next 2 weeks.

## 2023-06-01 ENCOUNTER — Ambulatory Visit (HOSPITAL_COMMUNITY)
Admission: EM | Admit: 2023-06-01 | Discharge: 2023-06-01 | Disposition: A | Discharge status: Home / Self Care | Payer: MEDICAID

## 2023-06-01 ENCOUNTER — Ambulatory Visit (INDEPENDENT_AMBULATORY_CARE_PROVIDER_SITE_OTHER): Payer: MEDICAID

## 2023-06-01 ENCOUNTER — Encounter (HOSPITAL_COMMUNITY): Payer: Self-pay | Admitting: Emergency Medicine

## 2023-06-01 DIAGNOSIS — S8391XA Sprain of unspecified site of right knee, initial encounter: Secondary | ICD-10-CM

## 2023-06-01 DIAGNOSIS — M25461 Effusion, right knee: Secondary | ICD-10-CM

## 2023-06-01 DIAGNOSIS — M25561 Pain in right knee: Secondary | ICD-10-CM

## 2023-06-01 HISTORY — DX: Depression, unspecified: F32.A

## 2023-06-01 MED ORDER — DICLOFENAC SODIUM 50 MG PO TBEC: 50.0000 mg | DELAYED_RELEASE_TABLET | Freq: Two times a day (BID) | ORAL | 0 refills | Status: AC | PRN

## 2023-06-01 NOTE — ED Provider Notes (Signed)
 [facility]    CSN: [id number] Arrival date & time: 06/01/23  1148      History   Chief Complaint Chief Complaint  Patient presents with   Fall   Knee Injury    HPI Brooke Robinson is a 23 y.o. female.   Patient reports that she fell last night (evening of 05/31/2023).  She reports that it was a little sore but not bad and she went home and went to sleep.  She had slipped in some water.  She slept well but when she got up this morning she can barely move the right leg.  She has crutches that she had at home to walk because she cannot put significant weight on her right knee.   Fall Pertinent negatives include no chest pain and no abdominal pain.    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 Authorizing Provider  diclofenac (VOLTAREN) 50 MG EC tablet Take 1 tablet (50 mg total) by mouth 2 (two) times daily as needed for up to 10 days (knee pain - take with food). 06/01/23 06/11/23 Yes Prescilla Sours, FNP  losartan (COZAAR) 25 MG tablet Take by mouth. 05/28/23 06/27/23 Yes [provider]  valACYclovir (VALTREX) 1000 MG tablet Take 1 tablet  by mouth daily as needed. 03/12/23  Yes [provider]  albuterol (VENTOLIN HFA) 108 (90 Base) MCG/ACT inhaler Inhale 2 puffs into the lungs every 6 (six) hours as needed for wheezing or shortness of breath. 05/14/22   [provider]  FLUoxetine (PROZAC) 20 MG capsule Take 1 capsule (20 mg total) by mouth daily. 10/20/22   Starleen Blue, NP  ibuprofen (ADVIL) 800 MG tablet Take 1 tablet (800 mg total) by mouth every 8 (eight) hours as needed for moderate pain (pain score 4-6). 04/25/23   Long, Arlyss Repress, MD  traZODone (DESYREL) 50 MG tablet Take 1 tablet (50 mg total) by mouth at bedtime as needed for sleep. 10/19/22   Starleen Blue, NP    Family History Family History  Problem Relation Age of Onset   Healthy Mother     Social History Social History   Tobacco Use   Smoking status: Never   Smokeless tobacco: Never  Vaping Use   Vaping status: Never Used  Substance Use Topics   Alcohol use: Never   Drug use: Never  Allergies   Apple   Review of Systems Review of Systems  Constitutional:  Negative for fever.  Respiratory:  Negative for cough.   Cardiovascular:  Negative for chest pain.  Gastrointestinal:  Negative for abdominal pain, constipation, diarrhea, nausea and vomiting.  Musculoskeletal:  Positive for joint swelling (Right knee). Negative for arthralgias (Right knee) and back pain.  Skin:  Negative for color change and rash.  Neurological:  Negative for syncope.  All other systems reviewed and are negative.    Physical Exam Triage Vital Signs ED Triage Vitals  Encounter Vitals Group     BP 06/01/23 1244 122/86     Systolic BP Percentile --      Diastolic BP Percentile --      Pulse Rate 06/01/23 1244 79     Resp 06/01/23 1244 15     Temp 06/01/23 1244 98.7 F (37.1 C)     Temp Source 06/01/23 1244 Oral     SpO2 06/01/23 1244 98 %     Weight --      Height --      Head Circumference --      Peak Flow --      Pain Score 06/01/23 1242  10     Pain Loc --      Pain Education --      Exclude from Growth Chart --    No data found.  Updated Vital Signs BP 122/86 (BP Location: Right Arm)   Pulse 79   Temp 98.7 F (37.1 C) (Oral)   Resp 15   LMP 05/07/2023   SpO2 98%   Visual Acuity Right Eye Distance:   Left Eye Distance:   Bilateral Distance:    Right Eye Near:   Left Eye Near:    Bilateral Near:     Physical Exam Vitals and nursing note reviewed.  Constitutional:      General: She is not in acute distress.    Appearance: She is well-developed. She is not ill-appearing or toxic-appearing.  HENT:     Head: Normocephalic and atraumatic.     Right Ear: External ear normal.     Left Ear: External ear normal.     Nose: Nose normal.     Mouth/Throat:     Lips: Pink.     Mouth: Mucous membranes are moist.  Eyes:     Conjunctiva/sclera: Conjunctivae normal.     Pupils: Pupils are equal, round, and reactive to light.  Cardiovascular:     Rate and Rhythm: Normal rate and regular rhythm.     Heart sounds: S1 normal and S2 normal. No murmur heard. Pulmonary:     Effort: Pulmonary effort is normal. No respiratory distress.     Breath sounds: Normal breath sounds. No decreased breath sounds, wheezing, rhonchi or rales.  Musculoskeletal:        General: No swelling.     Right hip: Normal.     Left hip: Normal.     Right upper leg: Normal.     Left upper leg: Normal.     Right knee: Swelling (Medial, lateral and suprapatellar) and effusion present. No erythema or ecchymosis. Decreased range of motion (Decreased extension due to pain). Tenderness present.     Left knee: Normal.  Skin:    General: Skin is warm and dry.     Capillary Refill: Capillary refill takes less than 2 seconds.     Findings: No rash.  Neurological:     Mental Status: She is  alert and oriented to person, place, and time.  Psychiatric:        Mood and Affect: Mood normal.      UC Treatments / Results  Labs (all labs ordered are  listed, but only abnormal results are displayed) Labs Reviewed - No data to display  EKG   Radiology DG Knee Complete 4 Views Right Result Date: 06/01/2023 CLINICAL DATA:  Right knee pain and swelling. EXAM: RIGHT KNEE - COMPLETE 4+ VIEW COMPARISON:  None Available. FINDINGS: No evidence of fracture, dislocation, or joint effusion. No evidence of arthropathy or other focal bone abnormality. Soft tissues are unremarkable. IMPRESSION: Negative. Electronically Signed   By: Lupita Raider M.D.   On: 06/01/2023 13:29    Procedures Procedures (including critical care time)  Medications Ordered in UC Medications - No data to display  Initial Impression / Assessment and Plan / UC Course  I have reviewed the triage vital signs and the nursing notes.  Pertinent labs & imaging results that were available during my care of the patient were reviewed by me and considered in my medical decision making (see chart for details).     Plan of care: See below for specific patient instructions Right knee sprain: X-rays were negative.  RICE therapy.  Provided an elastic knee brace.  Diclofenac 50 mg every 12 hours after food if needed for knee pain.  Work excuse provided. Follow-up if symptoms do not improve, worsen or new symptoms occur.  Final Clinical Impressions(s) / UC Diagnoses   Final diagnoses:  Acute pain of right knee  Swelling of right knee joint  Sprain of right knee, unspecified ligament, initial encounter     Discharge Instructions      X-ray is negative.  Diagnosis is a right knee sprain.  Encouraged compression with knee brace, rest, elevation, ice.  Ice pack should be used for half an hour remove for half an hour and applied again and 1/2-hour on half hour off rotation.  Patient has crutches and may use the crutches.  Use diclofenac 50 mg twice daily after food if needed for pain.  This will also help with inflammation.  Work excuse provided.  Follow-up if symptoms do not  improve, worsen or new symptoms occur.  May need to see orthopedics if symptoms persist.     ED Prescriptions     Medication Sig Dispense Auth. Provider   diclofenac (VOLTAREN) 50 MG EC tablet Take 1 tablet (50 mg total) by mouth 2 (two) times daily as needed for up to 10 days (knee pain - take with food). 20 tablet Prescilla Sours, FNP      PDMP not reviewed this encounter.   Prescilla Sours, FNP 06/01/23 1425

## 2023-06-01 NOTE — ED Triage Notes (Signed)
 Pt reports slipped and fell last night. Reports went home and straight to sleep. Didn't really feel pain til this morning. Pt came in using crutches.

## 2023-06-01 NOTE — Discharge Instructions (Addendum)
 X-ray is negative.  Diagnosis is a right knee sprain.  Encouraged compression with knee brace, rest, elevation, ice.  Ice pack should be used for half an hour remove for half an hour and applied again and 1/2-hour on half hour off rotation.  Patient has crutches and may use the crutches.  Use diclofenac 50 mg twice daily after food if needed for pain.  This will also help with inflammation.  Work excuse provided.  Follow-up if symptoms do not improve, worsen or new symptoms occur.  May need to see orthopedics if symptoms persist.

## 2023-06-27 NOTE — Progress Notes (Signed)
 Subjective   Patient ID:  Brooke Robinson is a 23 y.o. (DOB 08/17/2000) female    Patient presents with  . Hypertension    Follow up blood pressure      Hypertension   History of Present Illness The patient presents for a follow-up visit.  The chief complaint is related to blood pressure management. Blood pressure has improved with the current medication regimen. No adverse effects such as drowsiness or sleepiness are reported. Losartan is taken upon waking, which varies  depending on the work schedule. Energy drinks have been discontinued.  Fluoxetine  is currently prescribed and a refill is needed. Nightmares have improved, but daytime sleepiness and persistent fatigue upon waking are experienced. Uncertainty exists regarding whether these symptoms are related to the medication.  A diagnosis of bacterial vaginosis was made during a GYN visit, and metronidazole  was prescribed. Despite treatment, symptoms persist. The gel form of metronidazole  has not been used previously. No concerns about sexually transmitted diseases are reported, with testing conducted at [facility]. Symptoms are described as uncomfortable, and there is concern about the recurrent nature of the condition.  Weight loss is noted, and regular workouts and good eating habits are maintained. An OB appointment is scheduled for 07/10/2023.    Reviewed and updated this visit by provider: Tobacco  Allergies  Meds  Problems  Med Hx  Surg Hx  Fam Hx        Review of Systems  is complete and negative except as noted. Vitals:   06/25/23 1514  BP: 126/78  Patient Position: Sitting  Pulse: 88  Temp: 98.2 F (36.8 C)  TempSrc: Skin  Resp: 16  Height: 5' 5 (1.651 m)  Weight: 156 lb 6.4 oz (70.9 kg)  SpO2: 98%  BMI (Calculated): 26  PainSc: 0-No pain     Physical Exam Vitals and nursing note reviewed.  Constitutional:      General: She is not in acute distress.    Appearance: Normal appearance.   Cardiovascular:     Rate and Rhythm: Normal rate and regular rhythm.     Pulses: Normal pulses.     Heart sounds: Normal heart sounds. No murmur heard. Pulmonary:     Effort: Pulmonary effort is normal. No respiratory distress.     Breath sounds: Normal breath sounds.  Neurological:     Mental Status: She is alert and oriented to person, place, and time. Mental status is at baseline.  Psychiatric:        Mood and Affect: Mood normal.        Behavior: Behavior normal.        Thought Content: Thought content normal.        Assessment and Plan  1. Hypertension, unspecified type (Primary) -     losartan potassium (COZAAR) 25 mg tablet; Take one tablet (25 mg dose) by mouth daily., Starting Wed 06/25/2023, Until Mon 12/22/2023, Normal 2. MDD (major depressive disorder), recurrent, severe, with psychosis (*) -     fluoxetine  (PROZAC ) 40 MG capsule; Take one capsule (40 mg dose) by mouth daily., Starting Wed 06/25/2023, Until Mon 12/22/2023, Normal 3. Bacterial vaginosis -     metroNIDAZOLE  (METROGEL ) 0.75% vaginal gel; Place vaginally at bedtime for 7 days., Starting Wed 06/25/2023, Until Wed 07/02/2023, Normal    Assessment & Plan 1. Hypertension. - Blood pressure has shown significant improvement. - No side effects reported from current antihypertensive medication. - Encouraged to adjust the timing of medication intake to potentially alleviate daytime drowsiness. -  Prescription for losartan will be provided.  2. Depression. - Nightmares have improved, but persistent daytime fatigue reported. - No side effects reported from current antidepressant medication. - Encouraged to ensure adequate rest and monitor symptoms. - Prescription for fluoxetine  will be provided.  3. Bacterial vaginosis. - Persistent symptoms despite previous treatment with oral metronidazole . - No concerns for STDs; recent tests were negative. - Recommended to use metronidazole  gel, inserting it into the vagina at  bedtime for 7 days. - Advised to take a daily probiotic to maintain balanced vaginal flora.  Follow-up - Follow-up appointment scheduled in 3 months.  Follow up in about 3 months (around 09/24/2023) for HTN AND DEPRESSON .  Computer technology was used to create visit note.  Consent from the patient/caregiver was obtained prior to its use.      Patient's Medications  New Prescriptions   METRONIDAZOLE  (METROGEL ) 0.75% VAGINAL GEL    Place vaginally at bedtime for 7 days.  Previous Medications   ALBUTEROL SULFATE HFA (PROVENTIL,VENTOLIN,PROAIR) 108 (90 BASE) MCG/ACT INHALER    Inhale two puffs into the lungs every 6 (six) hours as needed for Wheezing.   FLUTICASONE  PROPIONATE (FLONASE ) 50 MCG/ACTUATION NASAL SPRAY       IBUPROFEN  (ADVIL ,MOTRIN ) 600 MG TABLET    Take one tablet (600 mg dose) by mouth.   IBUPROFEN  (ADVIL ,MOTRIN ) 800 MG TABLET    Take one tablet (800 mg dose) by mouth every 8 (eight) hours as needed.   PROBIOTIC PRODUCT (PROBIOTIC BLEND PO)    Take by mouth every morning.   TRIAMCINOLONE (NASACORT,NASAL ALLERGY 24H) 55 MCG/ACT NASAL INHALER    two sprays by Both Nostrils route daily.   VALACYCLOVIR (VALTREX) 1000 MG TABLET    Take one tablet (1,000 mg dose) by mouth daily as needed.  Modified Medications   Modified Medication Previous Medication   FLUOXETINE  (PROZAC ) 40 MG CAPSULE FLUoxetine  (PROZAC ) 40 MG capsule      Take one capsule (40 mg dose) by mouth daily.    Take one capsule (40 mg dose) by mouth daily.   LOSARTAN POTASSIUM (COZAAR) 25 MG TABLET losartan potassium (COZAAR) 25 mg tablet      Take one tablet (25 mg dose) by mouth daily.    Take one tablet (25 mg dose) by mouth daily for 30 days.  Discontinued Medications   FLUTICASONE  PROPIONATE (FLONASE ) 50 MCG/ACTUATION NASAL SPRAY    one spray by Intranasal route daily.   TRAZODONE  (DESYREL ) 50 MG TABLET    Take one tablet (50 mg dose) by mouth at bedtime.        Risks, benefits, and alternatives of the  medications and treatment plan prescribed today were discussed, and patient expressed understanding. Plan follow-up as discussed or as needed if any worsening symptoms or change in condition.   A yearly preventative health exam was recommended and current age based recommendations were discussed.

## 2023-09-11 ENCOUNTER — Encounter (HOSPITAL_COMMUNITY): Payer: Self-pay

## 2023-09-11 ENCOUNTER — Emergency Department (HOSPITAL_COMMUNITY): Payer: MEDICAID

## 2023-09-11 ENCOUNTER — Other Ambulatory Visit: Payer: Self-pay

## 2023-09-11 ENCOUNTER — Emergency Department (HOSPITAL_COMMUNITY)
Admission: EM | Admit: 2023-09-11 | Discharge: 2023-09-11 | Disposition: A | Discharge status: Home / Self Care | Payer: MEDICAID | Attending: Emergency Medicine | Admitting: Emergency Medicine

## 2023-09-11 DIAGNOSIS — R55 Syncope and collapse: Secondary | ICD-10-CM | POA: Diagnosis present

## 2023-09-11 DIAGNOSIS — I10 Essential (primary) hypertension: Secondary | ICD-10-CM | POA: Insufficient documentation

## 2023-09-11 DIAGNOSIS — Z79899 Other long term (current) drug therapy: Secondary | ICD-10-CM | POA: Insufficient documentation

## 2023-09-11 LAB — CBC WITH DIFFERENTIAL/PLATELET
Abs Immature Granulocytes: 0.01 K/uL (ref 0.00–0.07)
Basophils Absolute: 0 K/uL (ref 0.0–0.1)
Basophils Relative: 1 %
Eosinophils Absolute: 0.2 K/uL (ref 0.0–0.5)
Eosinophils Relative: 4 %
HCT: 34.3 % — ABNORMAL LOW (ref 36.0–46.0)
Hemoglobin: 11.7 g/dL — ABNORMAL LOW (ref 12.0–15.0)
Immature Granulocytes: 0 %
Lymphocytes Relative: 39 %
Lymphs Abs: 2.2 K/uL (ref 0.7–4.0)
MCH: 29.3 pg (ref 26.0–34.0)
MCHC: 34.1 g/dL (ref 30.0–36.0)
MCV: 86 fL (ref 80.0–100.0)
Monocytes Absolute: 0.5 K/uL (ref 0.1–1.0)
Monocytes Relative: 9 %
Neutro Abs: 2.8 K/uL (ref 1.7–7.7)
Neutrophils Relative %: 47 %
Platelets: 247 K/uL (ref 150–400)
RBC: 3.99 MIL/uL (ref 3.87–5.11)
RDW: 13.9 % (ref 11.5–15.5)
WBC: 5.7 K/uL (ref 4.0–10.5)
nRBC: 0 % (ref 0.0–0.2)

## 2023-09-11 LAB — COMPREHENSIVE METABOLIC PANEL WITH GFR
ALT: 14 U/L (ref 0–44)
AST: 18 U/L (ref 15–41)
Albumin: 3.6 g/dL (ref 3.5–5.0)
Alkaline Phosphatase: 37 U/L — ABNORMAL LOW (ref 38–126)
Anion gap: 6 (ref 5–15)
BUN: 8 mg/dL (ref 6–20)
CO2: 24 mmol/L (ref 22–32)
Calcium: 8.8 mg/dL — ABNORMAL LOW (ref 8.9–10.3)
Chloride: 108 mmol/L (ref 98–111)
Creatinine, Ser: 0.91 mg/dL (ref 0.44–1.00)
GFR, Estimated: >60 mL/min (ref >60)
Glucose, Bld: 96 mg/dL (ref 70–99)
Potassium: 3.7 mmol/L (ref 3.5–5.1)
Sodium: 138 mmol/L (ref 135–145)
Total Bilirubin: 0.7 mg/dL (ref 0.0–1.2)
Total Protein: 6.5 g/dL (ref 6.5–8.1)

## 2023-09-11 LAB — HCG, SERUM, QUALITATIVE: Preg, Serum: NEGATIVE

## 2023-09-11 LAB — CBG MONITORING, ED: Glucose-Capillary: 99 mg/dL (ref 70–99)

## 2023-09-11 NOTE — ED Triage Notes (Signed)
 Pt BIB [facility] from work (works for [employer] outside) d/t 3-4 syncopal events.  Pt did not hit head but did have LOC.  Denies any injury but does have Headache.    BP 146/80 HR 50 O2 98%

## 2023-09-11 NOTE — ED Provider Notes (Signed)
 [facility] Provider Note   CSN: [id number] Arrival date & time: 09/11/23  1943    Patient presents with: Loss of Consciousness   Brooke Robinson is a 23 y.o. female with PMHx of HTN who presents for evaluation of syncope that occurred earlier at work. Patient works at [employer] and was outside sitting in a chair waiting for a driver to return when she suddenly felt a hot flash and felt flushed + nauseous, lowered herself off the chair and onto the ground, and then passed out. She denies falling/hitting her head. She has no history of seizures and her manager who witnessed the syncope did not report any concern for seizure-like activity to the patient. She did not have full recovery from the syncope or attempt to get up but thinks she may have passed out again shortly after waking back up, but doesn't recall. She denies preceding chest pain, palpitations, headache, or SOB. She denies all of these symptoms following the syncope aside from a mild-moderate headache. Denies any specific vision changes before the syncope. States that this has happened once before in early June while outside at work. Denies significant caffeine use, illicit substance use (aside from nicotine vape), or recent alcohol use. Denies possibility of active pregnancy. States that she ate and drank fluids earlier in the day prior to the syncope.    Prior to Admission medications   Medication Sig Start Date End Date Taking? Authorizing Provider  albuterol (VENTOLIN HFA) 108 (90 Base) MCG/ACT inhaler Inhale 2 puffs into the lungs every 6 (six) hours as needed for wheezing or shortness of breath. 05/14/22   [provider]  FLUoxetine  (PROZAC ) 20 MG capsule Take 1 capsule (20 mg total) by mouth daily. 10/20/22   Tex Drilling, NP  ibuprofen  (ADVIL ) 800 MG tablet Take 1 tablet (800 mg total) by mouth every 8 (eight) hours as needed for moderate pain (pain score 4-6). 04/25/23   Long,  Joshua G, MD  losartan (COZAAR) 25 MG tablet Take by mouth. 05/28/23 06/27/23  [provider]  traZODone  (DESYREL ) 50 MG tablet Take 1 tablet (50 mg total) by mouth at bedtime as needed for sleep. 10/19/22   Tex Drilling, NP  valACYclovir (VALTREX) 1000 MG tablet Take 1 tablet by mouth daily as needed. 03/12/23   [provider]    Allergies: Apple     Updated Vital Signs BP 132/77   Pulse (!) 59   Temp 98.7 F (37.1 C)   Resp 12   Ht 5' 5 (1.651 m)   Wt 68.9 kg   LMP 08/29/2023   SpO2 100%   BMI 25.29 kg/m   Physical Exam Vitals reviewed.  Constitutional:      General: She is not in acute distress.    Appearance: She is not toxic-appearing or diaphoretic.  HENT:     Head: Normocephalic and atraumatic.     Nose: Nose normal. No rhinorrhea.     Mouth/Throat:     Mouth: Mucous membranes are moist.     Pharynx: Oropharynx is clear. No oropharyngeal exudate.  Eyes:     General: No scleral icterus.    Extraocular Movements: Extraocular movements intact.     Pupils: Pupils are equal, round, and reactive to light.  Cardiovascular:     Rate and Rhythm: Regular rhythm.     Pulses: Normal pulses.     Heart sounds: No murmur heard.    No friction rub. No gallop.  Comments: Borderline bradycardia (high 50s-low 60s) Pulmonary:     Effort: Pulmonary effort is normal. No respiratory distress.     Breath sounds: Normal breath sounds.  Chest:     Chest wall: No tenderness.  Abdominal:     General: Abdomen is flat. There is no distension.     Palpations: Abdomen is soft.     Tenderness: There is no abdominal tenderness. There is no guarding.  Musculoskeletal:        General: No deformity. Normal range of motion.     Cervical back: Normal range of motion and neck supple. No rigidity or tenderness.     Right lower leg: No edema.     Left lower leg: No edema.  Skin:    General: Skin is warm and dry.     Capillary Refill: Capillary refill takes less than 2  seconds.     Coloration: Skin is not jaundiced or pale.     Findings: No bruising.  Neurological:     Mental Status: She is alert and oriented to person, place, and time. Mental status is at baseline.     Cranial Nerves: No cranial nerve deficit.     Sensory: No sensory deficit.     Motor: No weakness.     (all labs ordered are listed, but only abnormal results are displayed) Labs Reviewed  CBC WITH DIFFERENTIAL/PLATELET - Abnormal; Notable for the following components:      Result Value   Hemoglobin 11.7 (*)    HCT 34.3 (*)    All other components within normal limits  COMPREHENSIVE METABOLIC PANEL WITH GFR - Abnormal; Notable for the following components:   Calcium 8.8 (*)    Alkaline Phosphatase 37 (*)    All other components within normal limits  HCG, SERUM, QUALITATIVE  CBG MONITORING, ED    EKG: EKG Interpretation Date/Time:  Thursday September 11 2023 19:48:49 EDT Ventricular Rate:  58 PR Interval:  200 QRS Duration:  90 QT Interval:  418 QTC Calculation: 411 R Axis:   97  Text Interpretation: Sinus rhythm Borderline right axis deviation ST elev, probable normal early repol pattern Confirmed by Bari Flank [phone]) on 09/11/2023 8:22:03 PM  Radiology: DG Chest Portable 1 View Result Date: 09/11/2023 CLINICAL DATA:  Syncope. EXAM: PORTABLE CHEST 1 VIEW COMPARISON:  December 29, 2015 FINDINGS: The heart size and mediastinal contours are within normal limits. Both lungs are clear. The visualized skeletal structures are unremarkable. IMPRESSION: No active disease. Electronically Signed   By: Suzen Dials M.D.   On: 09/11/2023 20:37     Medications Ordered in the ED - No data to display  Clinical Course as of 09/12/23 0119  Fri Sep 12, 2023  0118 DG Chest Portable 1 View The heart size and mediastinal contours are within normal limits. Both lungs are clear. The visualized skeletal structures are unremarkable. [AD]  0118 Preg, Serum: NEGATIVE [AD]  0118  Hemoglobin(!): 11.7 At baseline. Otherwise CBC unremarkable [AD]  0118 Comprehensive metabolic panel(!) No acute abnormalities [AD]  0119 Glucose-Capillary: 99 [AD]    Clinical Course User Index [AD] Raoul Rake, MD   PERC Score: 0, PERC Score Interpretation: No need for further workup, as <2% chance of PE.  If no criteria are positive and clinicians pre-test probability is <15%, PERC Rule criteria are satisfied Medical Decision Making Patient with PMHx of HTN who presents for episode of 1-2 syncopal events back-to-back while outside at work int he heat earlier. Patient had prodromal symptoms of  flushing, feeling suddenly hot, and feeling nauseous. She did not experience chest pain, dyspnea, or vision changes prior to or following the syncope. She did develop a mild headache after the syncope but denies any head injury or fall associated with the syncope as she was able to lower herself to the ground before it happened.  Differentials include syncope due to vasovagal reaction, acute heat-related illness, cardiac arrhythmia, electrolyte derangement, hypoglycemia, anemia, dehydration/hypovolemia, pregnancy, illicit substance use/caffeine overuse (though patient denies). Low concern for PE given PERC negative. Low concern for stroke or intracranial insult given lack of focal neuro deficit and low risk overall given patient age and lack of significant risk factors.   As above in ED course, patient's workup overall unremarkable for etiology of syncope. POC glucose wnl, EKG stable with prior baselines/no significant arrhythmias noted, bHCG negative, and other basic labs/CXR with no acute findings. Patient had no recurrent or worsening symptoms while in ED and states her headache resolved with no intervention.  Etiology favored to be vasovagal or dehydration/heat-related syncope, though cannot rule out arrhythmia no longer present while in ED. Will plan for cardiology outpatient referral for  consideration of Zio Patch. Otherwise, patient stable for discharge.   Amount and/or Complexity of Data Reviewed Labs: ordered. Decision-making details documented in ED Course. Radiology: ordered. Decision-making details documented in ED Course. ECG/medicine tests: ordered.    Details: Interpretation as above   Final diagnoses:  Syncope and collapse    ED Discharge Orders          Ordered    Ambulatory referral to Cardiology        09/11/23 2205               Raoul Rake, MD 09/12/23 0127    Bari Roxie HERO, DO 09/20/23 1535

## 2023-09-11 NOTE — ED Notes (Signed)
 Pt ambulatory to restroom with steady gait. No dizziness or lightheadedness.

## 2023-09-11 NOTE — Discharge Instructions (Signed)
 You were seen today for syncope (passing out). While you were here we monitored your vitals, preformed a physical exam, and checked labs/EKG/chest xray. These were all reassuring and there is no indication for any further testing or intervention in the emergency department at this time.   Things to do:  - Follow up with your primary care provider as previously scheduled later this month - Schedule an appointment with cardiologist outpatient for follow up and consideration of placing a Zio Patch   Return to the emergency department if you have any new or worsening symptoms, or if you have any other emergent medical concerns.

## 2023-12-01 ENCOUNTER — Other Ambulatory Visit: Payer: Self-pay | Admitting: Medical Genetics

## 2023-12-12 ENCOUNTER — Ambulatory Visit (HOSPITAL_COMMUNITY)
Admission: EM | Admit: 2023-12-12 | Discharge: 2023-12-12 | Disposition: A | Discharge status: Home / Self Care | Payer: Self-pay | Attending: Family Medicine | Admitting: Family Medicine

## 2023-12-12 DIAGNOSIS — R051 Acute cough: Secondary | ICD-10-CM

## 2023-12-12 DIAGNOSIS — J069 Acute upper respiratory infection, unspecified: Secondary | ICD-10-CM

## 2023-12-12 LAB — POC COVID19/FLU A&B COMBO
Covid Antigen, POC: NEGATIVE
Influenza A Antigen, POC: NEGATIVE
Influenza B Antigen, POC: NEGATIVE

## 2023-12-12 MED ORDER — BENZONATATE 200 MG PO CAPS: 200.0000 mg | ORAL_CAPSULE | Freq: Three times a day (TID) | ORAL | 0 refills | Status: AC | PRN

## 2023-12-12 NOTE — ED Provider Notes (Signed)
 [facility]    CSN: [id number] Arrival date & time: 12/12/23  9076      History   Chief Complaint Chief Complaint  Patient presents with   Cough    HPI Brooke Robinson is a 23 y.o. female.    Cough Associated symptoms: rhinorrhea   Associated symptoms: no shortness of breath and no wheezing    Patient is here for URI symptoms x 3-4 days.  Having sore throat, runny nose, ear itching, cough, sinus pressure.  No known fevers.  No sob or wheezing.  No otc meds taken.  Daughter is now sick as well.        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 Authorizing Provider  albuterol (VENTOLIN HFA) 108 (90 Base) MCG/ACT inhaler Inhale 2 puffs into the lungs every 6 (six) hours as needed for wheezing or shortness of breath. 05/14/22   [provider]  FLUoxetine  (PROZAC ) 20 MG capsule Take 1 capsule (20 mg total) by mouth daily. 10/20/22   Tex Drilling, NP  ibuprofen  (ADVIL ) 800 MG tablet Take 1 tablet (800 mg total) by mouth every 8 (eight) hours as needed for moderate pain (pain score 4-6). 04/25/23   Long, Joshua G, MD  losartan  (COZAAR) 25 MG tablet Take by mouth. 05/28/23 06/27/23  [provider]  traZODone  (DESYREL ) 50 MG tablet Take 1 tablet (50 mg total) by mouth at bedtime as needed for sleep. 10/19/22   Tex Drilling, NP  valACYclovir (VALTREX) 1000 MG tablet Take 1 tablet by mouth daily as needed. 03/12/23   [provider]    Family History Family History  Problem Relation Age of Onset   Healthy Mother     Social History Social History   Tobacco Use   Smoking status: Never   Smokeless tobacco: Never  Vaping Use   Vaping status: Never Used  Substance Use Topics   Alcohol use: Never   Drug use: Never     Allergies   Apple   Review of Systems Review of Systems  Constitutional: Negative.   HENT:  Positive for congestion and rhinorrhea.   Respiratory:  Positive for cough. Negative for shortness of breath and wheezing.   Cardiovascular: Negative.   Gastrointestinal: Negative.   Musculoskeletal: Negative.   Psychiatric/Behavioral: Negative.       Physical Exam Triage Vital  Signs ED Triage Vitals  Encounter Vitals Group     BP 12/12/23 0954 126/88     Girls Systolic BP Percentile --      Girls Diastolic BP Percentile --      Boys Systolic BP Percentile --      Boys Diastolic BP Percentile --      Pulse Rate 12/12/23 0954 81     Resp 12/12/23 0954 18     Temp 12/12/23 0954 97.9 F (36.6 C)     Temp Source 12/12/23 0954 Oral     SpO2 12/12/23 0954 97 %     Weight 12/12/23 0954 153 lb (69.4 kg)     Height 12/12/23 0954 5' 5 (1.651 m)     Head Circumference --      Peak Flow --      Pain Score 12/12/23 0953 8     Pain Loc --      Pain Education --      Exclude from Growth Chart --    No data found.  Updated Vital Signs BP 126/88 (BP Location: Right Arm)   Pulse 81   Temp 97.9 F (36.6 C) (Oral)   Resp 18   Ht 5' 5 (1.651 m)   Wt 69.4 kg   LMP 11/12/2023 (Approximate)   SpO2 97%   BMI 25.46 kg/m   Visual Acuity Right Eye Distance:   Left Eye  Distance:   Bilateral Distance:    Right Eye Near:   Left Eye Near:    Bilateral Near:     Physical Exam Constitutional:      General: She is not in acute distress.    Appearance: Normal appearance. She is normal weight. She is not ill-appearing or toxic-appearing.  HENT:     Nose: Congestion and rhinorrhea present.     Right Sinus: Maxillary sinus tenderness present.     Left Sinus: Maxillary sinus tenderness present.     Mouth/Throat:     Mouth: Mucous membranes are moist.  Cardiovascular:     Rate and Rhythm: Normal rate and regular rhythm.  Pulmonary:     Effort: Pulmonary effort is normal.     Breath sounds: Normal breath sounds. No wheezing or rhonchi.  Musculoskeletal:     Cervical back: Normal range of motion and neck supple. No tenderness.  Skin:    General: Skin is warm.  Neurological:     General: No focal deficit present.     Mental Status: She is alert.  Psychiatric:        Mood and Affect: Mood normal.      UC Treatments / Results  Labs (all labs ordered are listed, but only abnormal results are displayed) Labs Reviewed  POC COVID19/FLU A&B COMBO    EKG   Radiology No results found.  Procedures Procedures (including critical care time)  Medications Ordered in UC Medications - No data to display  Initial Impression / Assessment and Plan / UC Course  I have reviewed the triage vital signs and the nursing notes.  Pertinent labs & imaging results that were available during my care of the patient were reviewed by me and considered in my medical decision making (see chart for details).   Final Clinical Impressions(s) / UC Diagnoses   Final diagnoses:  Acute cough  Viral URI with cough     Discharge Instructions      You were diagnosed with a viral upper respiratory infection.  Your flu/covid swab was negative.  I have sent out a medication to help with cough.  Please get rest and fluids.  Please return if not improving or worsening.      ED Prescriptions     Medication Sig Dispense Auth. Provider   benzonatate (TESSALON) 200 MG capsule Take 1 capsule (200 mg total) by mouth 3 (three) times daily as needed for cough. 21 capsule Darral Longs, MD      PDMP not reviewed this encounter.   Darral Longs, MD 12/12/23 1044

## 2023-12-12 NOTE — ED Triage Notes (Signed)
 Sore throat, runny nose, bilateral ear itching (worse on right), cough, and sinus pain onset 3 days ago. No known sick exposure, but states she does work outdoors. Daughter is now sick as well being seen today.   Patient has not taken any meds for her symptoms.

## 2023-12-12 NOTE — Discharge Instructions (Addendum)
 You were diagnosed with a viral upper respiratory infection.  Your flu/covid swab was negative.   I have sent out a medication to help with cough.  Please get rest and fluids.  Please return if not improving or worsening.

## 2024-04-07 ENCOUNTER — Encounter (HOSPITAL_COMMUNITY): Payer: Self-pay | Admitting: *Deleted

## 2024-04-07 ENCOUNTER — Emergency Department (HOSPITAL_COMMUNITY): Payer: Self-pay

## 2024-04-07 ENCOUNTER — Emergency Department (HOSPITAL_COMMUNITY): Admission: EM | Admit: 2024-04-07 | Discharge: 2024-04-08 | Disposition: A | Payer: Self-pay

## 2024-04-07 ENCOUNTER — Other Ambulatory Visit: Payer: Self-pay

## 2024-04-07 DIAGNOSIS — R55 Syncope and collapse: Secondary | ICD-10-CM | POA: Insufficient documentation

## 2024-04-07 DIAGNOSIS — I10 Essential (primary) hypertension: Secondary | ICD-10-CM | POA: Insufficient documentation

## 2024-04-07 DIAGNOSIS — G43509 Persistent migraine aura without cerebral infarction, not intractable, without status migrainosus: Secondary | ICD-10-CM | POA: Insufficient documentation

## 2024-04-07 DIAGNOSIS — R531 Weakness: Secondary | ICD-10-CM

## 2024-04-07 LAB — I-STAT CHEM 8, ED
BUN: 8 mg/dL (ref 6–20)
Calcium, Ion: 1.17 mmol/L (ref 1.15–1.40)
Chloride: 105 mmol/L (ref 98–111)
Creatinine, Ser: 1 mg/dL (ref 0.44–1.00)
Glucose, Bld: 88 mg/dL (ref 70–99)
HCT: 36 % (ref 36.0–46.0)
Hemoglobin: 12.2 g/dL (ref 12.0–15.0)
Potassium: 3.9 mmol/L (ref 3.5–5.1)
Sodium: 142 mmol/L (ref 135–145)
TCO2: 23 mmol/L (ref 22–32)

## 2024-04-07 LAB — DIFFERENTIAL
Abs Immature Granulocytes: 0.01 10*3/uL (ref 0.00–0.07)
Basophils Absolute: 0 10*3/uL (ref 0.0–0.1)
Basophils Relative: 1 %
Eosinophils Absolute: 0.3 10*3/uL (ref 0.0–0.5)
Eosinophils Relative: 5 %
Immature Granulocytes: 0 %
Lymphocytes Relative: 44 %
Lymphs Abs: 2.7 10*3/uL (ref 0.7–4.0)
Monocytes Absolute: 0.4 10*3/uL (ref 0.1–1.0)
Monocytes Relative: 7 %
Neutro Abs: 2.7 10*3/uL (ref 1.7–7.7)
Neutrophils Relative %: 43 %

## 2024-04-07 LAB — COMPREHENSIVE METABOLIC PANEL WITH GFR
ALT: 11 U/L (ref 0–44)
AST: 20 U/L (ref 15–41)
Albumin: 4.3 g/dL (ref 3.5–5.0)
Alkaline Phosphatase: 52 U/L (ref 38–126)
Anion gap: 8 (ref 5–15)
BUN: 8 mg/dL (ref 6–20)
CO2: 26 mmol/L (ref 22–32)
Calcium: 9.6 mg/dL (ref 8.9–10.3)
Chloride: 106 mmol/L (ref 98–111)
Creatinine, Ser: 0.83 mg/dL (ref 0.44–1.00)
GFR, Estimated: >60 mL/min
Glucose, Bld: 84 mg/dL (ref 70–99)
Potassium: 4 mmol/L (ref 3.5–5.1)
Sodium: 139 mmol/L (ref 135–145)
Total Bilirubin: 0.3 mg/dL (ref 0.0–1.2)
Total Protein: 7.2 g/dL (ref 6.5–8.1)

## 2024-04-07 LAB — PROTIME-INR
INR: 1 (ref 0.8–1.2)
Prothrombin Time: 13.2 s (ref 11.4–15.2)

## 2024-04-07 LAB — CBC
HCT: 35.4 % — ABNORMAL LOW (ref 36.0–46.0)
Hemoglobin: 12 g/dL (ref 12.0–15.0)
MCH: 28.6 pg (ref 26.0–34.0)
MCHC: 33.9 g/dL (ref 30.0–36.0)
MCV: 84.3 fL (ref 80.0–100.0)
Platelets: 325 10*3/uL (ref 150–400)
RBC: 4.2 MIL/uL (ref 3.87–5.11)
RDW: 13.3 % (ref 11.5–15.5)
WBC: 6.2 10*3/uL (ref 4.0–10.5)
nRBC: 0 % (ref 0.0–0.2)

## 2024-04-07 LAB — APTT: aPTT: 29 s (ref 24–36)

## 2024-04-07 LAB — ETHANOL: Alcohol, Ethyl (B): <15 mg/dL

## 2024-04-07 LAB — CBG MONITORING, ED: Glucose-Capillary: 100 mg/dL — ABNORMAL HIGH (ref 70–99)

## 2024-04-07 LAB — HCG, SERUM, QUALITATIVE: Preg, Serum: NEGATIVE

## 2024-04-07 MED ORDER — PROCHLORPERAZINE EDISYLATE 10 MG/2ML IJ SOLN
5.0000 mg | Freq: Once | INTRAMUSCULAR | Status: AC
  Administered 2024-04-07: 5 mg via INTRAVENOUS
  Filled 2024-04-07: qty 2

## 2024-04-07 MED ORDER — SODIUM CHLORIDE 0.9 % IV BOLUS
1000.0000 mL | Freq: Once | INTRAVENOUS | Status: AC
  Administered 2024-04-07: 1000 mL via INTRAVENOUS

## 2024-04-07 MED ORDER — KETOROLAC TROMETHAMINE 15 MG/ML IJ SOLN
15.0000 mg | Freq: Once | INTRAMUSCULAR | Status: AC
  Administered 2024-04-07: 15 mg via INTRAVENOUS
  Filled 2024-04-07: qty 1

## 2024-04-07 MED ORDER — DIPHENHYDRAMINE HCL 50 MG/ML IJ SOLN
25.0000 mg | Freq: Once | INTRAMUSCULAR | Status: AC
  Administered 2024-04-07: 25 mg via INTRAVENOUS
  Filled 2024-04-07: qty 1

## 2024-04-07 NOTE — Discharge Instructions (Signed)
 Please follow-up with cardiologist regarding your syncopal episode.  Please continue taking medications.  For your blood pressure, please write down your blood pressure on a piece of paper 2 times daily.  Take these readings to your primary care physician to see if your medicine needs to be adjusted.  You can follow-up with neurology outpatient to continue to have headaches.

## 2024-04-07 NOTE — ED Notes (Signed)
Code stroke cancelled 

## 2024-04-07 NOTE — Code Documentation (Signed)
 Stroke Response Nurse Documentation Code Documentation  Brooke Robinson is a 24 y.o. female arriving to [place]  via Private Vehicle on 2/4 with past medical hx of HTN. On No antithrombotic. Code stroke was activated by ED.   Patient from home where she was LKW at 1910 and now complaining of left sided weakness.   Stroke team at the bedside on patient arrival. Labs drawn and patient cleared for CT by Dr. Simon. Patient to CT with team. NIHSS 5, see documentation for details and code stroke times. Patient with left arm weakness, left leg weakness, and left decreased sensation on exam. The following imaging was completed:  CT Head and MRI. Patient is not a candidate for IV Thrombolytic due to neg MRI. Patient is not a candidate for IR due to neg MRI.   Code stroke cancelled.    Bedside handoff with ED RN.    Griselda Alm ORN  Rapid Response RN

## 2024-04-07 NOTE — ED Triage Notes (Signed)
 She has hypertension and tonight she felt her bp rising and she fainted she reports having the same type of problem in the past she is also c/o her lt arm tingling  lmp 2-1

## 2024-04-07 NOTE — Consult Note (Signed)
 NEUROLOGY CONSULT NOTE   Date of service: 04-25-2024 Patient Name: Brooke Robinson MRN:  [id number] DOB:  2000-04-11 Chief Complaint: Weakness Requesting Provider: Simon Lavonia SAILOR, MD   ASSESSMENT   Weakness -giveaway left-sided weakness as well as complaints of left-sided numbness and mild headache.  No evidence of stroke on my preliminary review of patient's MRI of the brain, official reading pending.  CT head is unremarkable.  Patient's presenting blood pressure is 160/112 differential includes conversion disorder, migraine with aura, hypertensive encephalopathy.  No TNK due to absence of stroke.  Labs reviewed, multiple labs pending.  RECOMMENDATIONS  - Management of hypertension per ED attending -Defer additional inpatient neurological workup -Neurology will follow as needed ________________________________  History of Present Illness  Brooke Robinson is a 24 y.o. female with chief complaint of high blood pressure in the evening today, passing out spell in the evening today..  The patient cannot tell how long she was unconscious and does not know any details.  This occurred at work.  She was brought to the ED and a code stroke was called for left-sided weakness and numbness.  Patient did not endorse left-sided symptoms during my interview.  I called a code off after MRI of the brain.    ROS  Comprehensive ROS performed and pertinent positives documented in HPI    Past History   Past Medical History:  Diagnosis Date   Depression    Herpes    Hypertension    Seasonal allergies     Past Surgical History:  Procedure Laterality Date   TONSILLECTOMY      Family History: Family History  Problem Relation Age of Onset   Healthy Mother     Social History  reports that she has never smoked. She has never used smokeless tobacco. She reports that she does not drink alcohol and does not use drugs.  Allergies[1]  Medications  Current Medications[2]  Vitals    Vitals:   04-25-24 2012 April 25, 2024 2024  BP: (!) 160/112   Pulse: 81   Resp: 16   Temp: 98.9 F (37.2 C)   SpO2: 100%   Weight:  69.4 kg  Height:  5' 5 (1.651 m)    Body mass index is 25.46 kg/m.   Physical Exam   Constitutional: Appears well-developed and well-nourished.  Psych: Affect appropriate to situation. Eyes: No scleral injection. HENT: No OP obstruction. Head: Normocephalic. Cardiovascular: Normal rate and regular rhythm.  Respiratory: Effort normal, non-labored breathing. GI: No distension.  Skin: WDI.  Neurologic Examination   Patient is alert and oriented x 3.  Pupils are equal round and reactive to light.  Speech is clear.  Language is fluent.  Face is symmetric.  Extraocular movements are normal.  Visual fields are full to confrontation.  Tone is normal.  Strength is giveaway weakness and insufficient effort in the left arm, almost complete lack of effort in the left leg.  Sensation is abnormal in the left side to light touch in the face and extremities.  Labs/Imaging/Neurodiagnostic studies   CBC:  Recent Labs  Lab 04/25/24 2052  HGB 12.2  HCT 36.0   Basic Metabolic Panel:  Lab Results  Component Value Date   NA 142 Apr 25, 2024   K 3.9 Apr 25, 2024   CO2 24 09/11/2023   GLUCOSE 88 April 25, 2024   BUN 8 25-Apr-2024   CREATININE 1.00 2024-04-25   CALCIUM 8.8 (L) 09/11/2023   GFRNONAA >60 09/11/2023   GFRAA >60 09/07/2019   Lipid Panel:  Lab  Results  Component Value Date   LDLCALC 148 (H) 10/14/2022   HgbA1c:  Lab Results  Component Value Date   HGBA1C 5.3 10/14/2022   Urine Drug Screen: No results found for: LABOPIA, COCAINSCRNUR, LABBENZ, AMPHETMU, THCU, LABBARB  Alcohol Level     Component Value Date/Time   ETH <10 10/14/2022 1754   INR No results found for: INR APTT No results found for: APTT AED levels: No results found for: PHENYTOIN, ZONISAMIDE, LAMOTRIGINE,  LEVETIRACETA   ______________________________________    Signed, Brooke Harju M Jasmia Angst, MD [facility]     [1]  Allergies Allergen Reactions   Apple Swelling  [2] No current facility-administered medications for this encounter.  Current Outpatient Medications:    albuterol (VENTOLIN HFA) 108 (90 Base) MCG/ACT inhaler, Inhale 2 puffs into the lungs every 6 (six) hours as needed for wheezing or shortness of breath., Disp: , Rfl:    benzonatate  (TESSALON ) 200 MG capsule, Take 1 capsule (200 mg total) by mouth 3 (three) times daily as needed for cough., Disp: 21 capsule, Rfl: 0   FLUoxetine  (PROZAC ) 20 MG capsule, Take 1 capsule (20 mg total) by mouth daily., Disp: 30 capsule, Rfl: 0   ibuprofen  (ADVIL ) 800 MG tablet, Take 1 tablet (800 mg total) by mouth every 8 (eight) hours as needed for moderate pain (pain score 4-6)., Disp: 21 tablet, Rfl: 0   losartan (COZAAR) 25 MG tablet, Take by mouth., Disp: , Rfl:    traZODone  (DESYREL ) 50 MG tablet, Take 1 tablet (50 mg total) by mouth at bedtime as needed for sleep., Disp: 30 tablet, Rfl: 0   valACYclovir (VALTREX) 1000 MG tablet, Take 1 tablet by mouth daily as needed., Disp: , Rfl:

## 2024-04-07 NOTE — ED Provider Triage Note (Signed)
 Emergency Medicine Provider Triage Evaluation Note  Brooke Robinson , a 24 y.o. female  was evaluated in triage.  Pt complains of a syncopal episode that happened at work earlier today.  Reports she felt very dizzy and then passed out.  No reported seizure-like activity.  No chest pain or shortness of breath.  Reports that after she awoke she drove herself into the ER.  She reports on her drive over to the ER, she developed left arm tingling which started at approximately 8 PM.  She reports she did not have any left arm numbness or tingling before this.  Review of Systems  Positive: As above Negative: As above  Physical Exam  BP (!) 160/112   Pulse 81   Temp 98.9 F (37.2 C)   Resp 16   Ht 5' 5 (1.651 m)   Wt 69.4 kg   LMP 04/04/2024   SpO2 100%   BMI 25.46 kg/m  Gen:   Awake, no distress   Resp:  Normal effort  MSK:   Moves extremities without difficulty  Other:  Pronator drift noted of the left upper extremity.  Patient with 4/5 strength in the left upper extremity compared to 5/5 in the left upper extremity.  Reports diminished sensation in the left upper extremity compared to right upper extremity.  Symmetric smile and eyebrow raise.  No difficulties with speech.  Medical Decision Making  Medically screening exam initiated at 8:41 PM.  Appropriate orders placed.  Cele Charlson was informed that the remainder of the evaluation will be completed by another provider, this initial triage assessment does not replace that evaluation, and the importance of remaining in the ED until their evaluation is complete.   Patient with L arm numbness and weakness that started at 8pm today, Code stroke activated.    Brooke Palma, PA-C 04/07/24 2041

## 2024-04-07 NOTE — ED Provider Notes (Signed)
 " [facility] AT [facility] Provider Note   CSN: [id number] Arrival date & time: 04/07/24  2010  An emergency department physician performed an initial assessment on this suspected stroke patient at 2043.  Patient presents with: Loss of Consciousness and Hypertension   Brooke Robinson is a 24 y.o. female.    Loss of Consciousness Hypertension   Patient states that she had a syncope episode while at work today.  She states that she was working and subsequent felt very hot and dizzy and subsequently passed out.  Patient states that she feels like she may have lost consciousness after sitting down while feeling lightheaded.  No seizure activity.  No history of seizures.  No chest pain or shortness of breath before or after the event.  Patient states this happened the past much and follow-up with cardiology.  Had a negative workup with cardiology.  She states on the way to the ER, she noticed some left arm tingling.     Stroke alert called out in triage    Previous medical history reviewed : Last seen in the ED in October 2025.  Cough.  Unremarkable workup.      Prior to Admission medications  Medication Sig Start Date End Date Taking? Authorizing Provider  albuterol (VENTOLIN HFA) 108 (90 Base) MCG/ACT inhaler Inhale 2 puffs into the lungs every 6 (six) hours as needed for wheezing or shortness of breath. 05/14/22   [provider]  benzonatate  (TESSALON ) 200 MG capsule Take 1 capsule (200 mg total) by mouth 3 (three) times daily as needed for cough. 12/12/23   Piontek, Rocky, MD  FLUoxetine  (PROZAC ) 20 MG capsule Take 1 capsule (20 mg total) by mouth daily. 10/20/22   Tex Drilling, NP  ibuprofen  (ADVIL ) 800 MG tablet Take 1 tablet (800 mg total) by mouth every 8 (eight) hours as needed for moderate pain (pain score 4-6). 04/25/23   Long, Joshua G, MD  losartan (COZAAR) 25 MG tablet Take by mouth. 05/28/23 06/27/23  [provider]   traZODone  (DESYREL ) 50 MG tablet Take 1 tablet (50 mg total) by mouth at bedtime as needed for sleep. 10/19/22   Tex Drilling, NP  valACYclovir (VALTREX) 1000 MG tablet Take 1 tablet by mouth daily as needed. 03/12/23   [provider]    Allergies: Apple    Review of Systems  Cardiovascular:  Positive for syncope.    Updated Vital Signs BP (!) 160/112   Pulse 81   Temp 98.9 F (37.2 C)   Resp 16   Ht 5' 5 (1.651 m)   Wt 69.4 kg   LMP 04/04/2024   SpO2 100%   BMI 25.46 kg/m   Physical Exam Vitals and nursing note reviewed.  Constitutional:      General: She is not in acute distress.    Appearance: She is well-developed.  HENT:     Head: Normocephalic and atraumatic.  Eyes:     Conjunctiva/sclera: Conjunctivae normal.  Cardiovascular:     Rate and Rhythm: Normal rate and regular rhythm.     Heart sounds: No murmur heard. Pulmonary:     Effort: Pulmonary effort is normal. No respiratory distress.     Breath sounds: Normal breath sounds.  Abdominal:     Palpations: Abdomen is soft.     Tenderness: There is no abdominal tenderness.  Musculoskeletal:        General: No swelling.     Cervical back: Neck supple.  Skin:  General: Skin is warm and dry.     Capillary Refill: Capillary refill takes less than 2 seconds.  Neurological:     Mental Status: She is alert.  Psychiatric:        Mood and Affect: Mood normal.     (all labs ordered are listed, but only abnormal results are displayed) Labs Reviewed  CBC - Abnormal; Notable for the following components:      Result Value   HCT 35.4 (*)    All other components within normal limits  CBG MONITORING, ED - Abnormal; Notable for the following components:   Glucose-Capillary 100 (*)    All other components within normal limits  PROTIME-INR  APTT  DIFFERENTIAL  ETHANOL  HCG, SERUM, QUALITATIVE  COMPREHENSIVE METABOLIC PANEL WITH GFR  URINE DRUG SCREEN  I-STAT CHEM 8, ED    EKG: EKG  Interpretation Date/Time:  Wednesday April 07 2024 21:43:19 EST Ventricular Rate:  64 PR Interval:  194 QRS Duration:  92 QT Interval:  397 QTC Calculation: 410 R Axis:   71  Text Interpretation: Sinus rhythm ST elev, probable normal early repol pattern Baseline wander in lead(s) I III aVL Confirmed by Simon Rea [phone]) on 04/07/2024 9:56:37 PM  Radiology: MR BRAIN WO CONTRAST Result Date: 04/07/2024 EXAM: MRI Brain Without Contrast 04/07/2024 09:35:00 PM TECHNIQUE: Multiplanar multisequence MRI of the head/brain was performed without the administration of intravenous contrast. COMPARISON: None available. CLINICAL HISTORY: Neuro deficit, acute, stroke suspected FINDINGS: BRAIN AND VENTRICLES: No acute infarct. No intracranial hemorrhage. No mass. No midline shift. No hydrocephalus. The sella is unremarkable. Normal flow voids. ORBITS: No significant abnormality. SINUSES AND MASTOIDS: No significant abnormality. BONES AND SOFT TISSUES: Normal marrow signal. No soft tissue abnormality. IMPRESSION: 1. Normal brain MRI Electronically signed by: Franky Stanford MD 04/07/2024 09:56 PM EST RP Workstation: [id number]   CT HEAD CODE STROKE WO CONTRAST (LKW 0-4.5h, LVO 0-24h) Result Date: 04/07/2024 EXAM: CT HEAD WITHOUT CONTRAST 04/07/2024 08:50:00 PM TECHNIQUE: CT of the head was performed without the administration of intravenous contrast. Automated exposure control, iterative reconstruction, and/or weight based adjustment of the mA/kV was utilized to reduce the radiation dose to as low as reasonably achievable. COMPARISON: 08/06/2022. CLINICAL HISTORY: Neuro deficit, acute, stroke suspected. Acute neurological deficit; stroke suspected. FINDINGS: BRAIN AND VENTRICLES: No acute hemorrhage. No evidence of acute infarct. No hydrocephalus. No extra-axial collection. No mass effect or midline shift. [place] Stroke Program Early CT Score (ASPECTS): Ganglionic (caudate, internal capsule, lentiform nucleus, insula,  M1-M3): 7. Supraganglionic (M4-M6): 3. Total: 10. ORBITS: No acute abnormality. SINUSES: No acute abnormality. SOFT TISSUES AND SKULL: No acute soft tissue abnormality. No skull fracture. IMPRESSION: 1. No acute intracranial abnormality. 2. ASPECTS is 10. Dr. Smigrodzki paged at 9:00 pm. Electronically signed by: Franky Stanford MD 04/07/2024 09:01 PM EST RP Workstation: [id number]     Procedures   Medications Ordered in the ED  prochlorperazine  (COMPAZINE ) injection 5 mg (has no administration in time range)  diphenhydrAMINE  (BENADRYL ) injection 25 mg (has no administration in time range)  ketorolac  (TORADOL ) 15 MG/ML injection 15 mg (has no administration in time range)  sodium chloride  0.9 % bolus 1,000 mL (1,000 mLs Intravenous New Bag/Given 04/07/24 2212)    Clinical Course as of 04/07/24 2332  Wed Apr 07, 2024  2121 MRI brain negative. Could observe and let go or observe. Could be migraine with aura. Weakness could be factitious.  [TL]  2326 Stable HO from TNL  CCX of syncopal event.  Long history  of syncope and follows with cardiology Stroke alert for weakness/sensory changes MRI/CTH negative Meds being administered for typical migraine. [CC]    Clinical Course User Index [CC] Jerral Meth, MD [TL] Simon Lavonia SAILOR, MD                   NIH Stroke Scale: 0              Medical Decision Making Risk Prescription drug management.     HPI:   Patient states that she had a syncope episode while at work today.  She states that she was working and subsequent felt very hot and dizzy and subsequently passed out.  Patient states that she feels like she may have lost consciousness after sitting down while feeling lightheaded.  No seizure activity.  No history of seizures.  No chest pain or shortness of breath before or after the event.  Patient states this happened the past much and follow-up with cardiology.  Had a negative workup with cardiology.  She states on the way to the ER,  she noticed some left arm tingling.     Stroke alert called out in triage    Previous medical history reviewed : Last seen in the ED in October 2025.  Cough.  Unremarkable workup.  MDM:   Upon examination, patient hemodynamically stable. A&O x 3 with GCS 15.  Patient had already received a CT head as well as MRI brain by the time I saw the patient.  Neurology Dr. Larrozola saw the patient in the setting of the CVA alert.  We reviewed patient CT head as well as MRI brain as well as performed physical exam.  Feel like this could be migraine aura.  Could be fictitious disorder.  On my exam, patient A and O x 3 GCS 15.  Cranial nerves II through XII intact.  No facial droop.  Sensation intact.  No diplopia.  No vision field cuts.  Patient motor exam normal.  Patient able to hold both arms against gravity.  In the left upper arm she initially said that maybe she noted some tingling when compared to the right but she further clarified that actually feels normal.  She states that she maybe felt like her left leg was little bit weaker than the right.  For me, she is able to hold the right up against gravity and resistance.  For the left leg, she was able to lifted up but not necessarily holding up against gravity.  Otherwise, sensory exam is normal.  Subsequently had the patient walk around.  She was able to walk around without difficulty.  Appropriate gait   In terms of syncope.  Likely orthostatic versus vasovagal.  Has had cardiac workup in the past which was unremarkable.  Sinus rhythm here on cardiac telemetry.  EKG sinus rhythm.   Reevaluation:   Upon reexamination, patient hemodynamically stable.  Remains A&O x 3 with GCS 15.  She states all of her sensory symptoms as well as motor symptoms are completely resolved.  Denies all complaints at this time other than ongoing headache.  She had received a liter of fluid.  Will give patient Compazine  Benadryl  as well as Toradol .  I think this could be  more of a complex migraine.   Patient will follow-up with neurology outpatient.  Also follow-up with cardiology.  Will be signed out in stable condition pending medication administration to see if she is feeling better.         Final  diagnoses:  Persistent migraine aura without cerebral infarction and without status migrainosus, not intractable  Vasovagal syncope    ED Discharge Orders     None          Simon Lavonia SAILOR, MD 04/07/24 2332  "

## 2024-04-08 NOTE — ED Provider Notes (Signed)
 Care of patient received from prior provider at 5:30 AM, please see their note for complete H/P and care plan.  Received handoff per ED course.  Clinical Course as of 04/08/24 0530  Wed Apr 07, 2024  2121 MRI brain negative. Could observe and let go or observe. Could be migraine with aura. Weakness could be factitious.  [TL]  2326 Stable HO from TNL  CCX of syncopal event.  Long history of syncope and follows with cardiology Stroke alert for weakness/sensory changes MRI/CTH negative Meds being administered for typical migraine. [CC]    Clinical Course User Index [CC] Jerral Meth, MD [TL] Simon Lavonia SAILOR, MD    Reassessment: Symptoms grossly improved on reassessment.  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 Headache completely resolved, patient feels comfortable with discharge.  All sensory symptoms resolved.  Disposition:  I have considered need for hospitalization, however, considering all of the above, I believe this patient is stable for discharge at this time.  Patient/family educated about specific return precautions for given chief complaint and symptoms.  Patient/family educated about follow-up with PCP .     Patient/family expressed understanding of return precautions and need for follow-up. Patient spoken to regarding all imaging and laboratory results and appropriate follow up for these results. All education provided in verbal form with additional information in written form. Time was allowed for answering of patient questions. Patient discharged.    Emergency Department Medication Summary:   Medications  sodium chloride  0.9 % bolus 1,000 mL (0 mLs Intravenous Stopped 04/08/24 0213)  prochlorperazine  (COMPAZINE ) injection 5 mg (5 mg Intravenous Given 04/07/24 2344)  diphenhydrAMINE  (BENADRYL ) injection 25 mg (25 mg Intravenous Given 04/07/24 2343)  ketorolac  (TORADOL ) 15 MG/ML injection 15 mg (15 mg Intravenous Given 04/07/24 2343)            Jerral Meth, MD 04/08/24 0530
